# Patient Record
Sex: Female | Born: 1965 | Race: White | Hispanic: Yes | Marital: Married | Smoking: Never smoker
Health system: Southern US, Community
[De-identification: ages and names within clinical notes are randomized; demographics above are authoritative.]

## PROBLEM LIST (undated history)

## (undated) DIAGNOSIS — B019 Varicella without complication: Secondary | ICD-10-CM

## (undated) DIAGNOSIS — T7840XA Allergy, unspecified, initial encounter: Secondary | ICD-10-CM

## (undated) HISTORY — PX: ABDOMINAL HYSTERECTOMY: SHX81

## (undated) HISTORY — DX: Allergy, unspecified, initial encounter: T78.40XA

## (undated) HISTORY — DX: Varicella without complication: B01.9

---

## 2007-02-07 ENCOUNTER — Ambulatory Visit (HOSPITAL_COMMUNITY): Admission: RE | Admit: 2007-02-07 | Discharge: 2007-02-07 | Payer: Self-pay | Admitting: Obstetrics & Gynecology

## 2007-02-25 ENCOUNTER — Emergency Department (HOSPITAL_COMMUNITY): Admission: EM | Admit: 2007-02-25 | Discharge: 2007-02-25 | Payer: Self-pay | Admitting: Emergency Medicine

## 2009-01-05 ENCOUNTER — Encounter: Payer: Self-pay | Admitting: Gynecology

## 2009-01-05 ENCOUNTER — Other Ambulatory Visit: Admission: RE | Admit: 2009-01-05 | Discharge: 2009-01-05 | Payer: Self-pay | Admitting: Gynecology

## 2009-01-05 ENCOUNTER — Ambulatory Visit: Payer: Self-pay | Admitting: Gynecology

## 2019-11-01 ENCOUNTER — Encounter: Payer: Self-pay | Admitting: Family Medicine

## 2019-11-01 ENCOUNTER — Other Ambulatory Visit: Payer: Self-pay

## 2019-11-01 ENCOUNTER — Ambulatory Visit (INDEPENDENT_AMBULATORY_CARE_PROVIDER_SITE_OTHER): Payer: BC Managed Care – PPO | Admitting: Family Medicine

## 2019-11-01 ENCOUNTER — Emergency Department (HOSPITAL_COMMUNITY)
Admission: EM | Admit: 2019-11-01 | Discharge: 2019-11-01 | Discharge status: Absent without leave | Payer: BC Managed Care – PPO

## 2019-11-01 VITALS — BP 110/70 | HR 92 | Temp 97.6°F | Resp 16 | Ht 61.0 in | Wt 150.4 lb

## 2019-11-01 DIAGNOSIS — Z13220 Encounter for screening for lipoid disorders: Secondary | ICD-10-CM

## 2019-11-01 DIAGNOSIS — R42 Dizziness and giddiness: Secondary | ICD-10-CM | POA: Diagnosis not present

## 2019-11-01 DIAGNOSIS — Z130 Encounter for screening for diseases of the blood and blood-forming organs and certain disorders involving the immune mechanism: Secondary | ICD-10-CM

## 2019-11-01 DIAGNOSIS — Z0000 Encounter for general adult medical examination without abnormal findings: Secondary | ICD-10-CM

## 2019-11-01 DIAGNOSIS — Z13228 Encounter for screening for other metabolic disorders: Secondary | ICD-10-CM

## 2019-11-01 DIAGNOSIS — Z1231 Encounter for screening mammogram for malignant neoplasm of breast: Secondary | ICD-10-CM

## 2019-11-01 DIAGNOSIS — Z1159 Encounter for screening for other viral diseases: Secondary | ICD-10-CM | POA: Diagnosis not present

## 2019-11-01 DIAGNOSIS — Z1329 Encounter for screening for other suspected endocrine disorder: Secondary | ICD-10-CM

## 2019-11-01 DIAGNOSIS — H5509 Other forms of nystagmus: Secondary | ICD-10-CM

## 2019-11-01 NOTE — ED Notes (Signed)
I called patient in the lobby and outside to be triage and no one responded 

## 2019-11-01 NOTE — Progress Notes (Signed)
HPI: Laura Daniel is a 54 y.o. female, who is here today to establish care.  Former PCP: Dr Acquanetta Chain. Moved from [place] in 01/2019. Last preventive routine visit: 1-2 years. Concerns today: Dizziness and she would like her CPE today. Chronic medical problems: Seasonal allergies and GI "problems. No Hx of DM,HTN,HLD,or CVD.  She lives with her husband and 2 daughters. S/P hysterectomy due to heavy menses. Colonoscopy in 2019. Sleeps about 5-6 hours. Mammogram > a year ago.  She exercises regularly, walking 2-3 times per week. A couple of weeks ago she started keto diet. Since she changed her diet she has not had digestive symptoms.  Dizziness: Started about 2-3 weeks ago. 3 weeks ago she started with nausea and vomiting after eating shrimp. While vomiting she felt ear fullness sensation and started with spinning sensation. Problem is intermittent. Exacerbated by head movement,getting up,and when lying on her back. It lasts a few seconds. Improved today. No prior Hx. No recent travel or respiratory illness. She has not noted hearing changes,tinnitus,headache,visual changes, or focal deficit. She has not tried OTC medication.  She has an appt with ENT in 11/2019.  Review of Systems  Constitutional: Negative for activity change, appetite change, fatigue and fever.  HENT: Negative for mouth sores, nosebleeds, rhinorrhea and sore throat.   Eyes: Negative for redness and visual disturbance.  Respiratory: Negative for cough, shortness of breath and wheezing.   Cardiovascular: Negative for chest pain, palpitations and leg swelling.  Gastrointestinal: Negative for abdominal pain, nausea and vomiting.       Negative for changes in bowel habits.  Genitourinary: Negative for decreased urine volume, dysuria and hematuria.  Musculoskeletal: Negative for gait problem and myalgias.  Skin: Negative for pallor and rash.  Allergic/Immunologic: Positive for environmental allergies.    Neurological: Negative for syncope, facial asymmetry and speech difficulty.  Rest see pertinent positives and negatives per HPI.  No current outpatient medications on file prior to visit.   No current facility-administered medications on file prior to visit.   Past Medical History:  Diagnosis Date  . Allergy   . Chicken pox    Allergies  Allergen Reactions  . Pollen Extract     Family History  Problem Relation Age of Onset  . Diabetes Mother   . Cancer Father   . Cancer Sister   . Asthma Daughter   . Alcohol abuse Maternal Grandfather   . Heart attack Maternal Grandfather     Social History   Socioeconomic History  . Marital status: Married    Spouse name: Not on file  . Number of children: Not on file  . Years of education: Not on file  . Highest education level: Not on file  Occupational History  . Not on file  Tobacco Use  . Smoking status: Never Smoker  . Smokeless tobacco: Never Used  Substance and Sexual Activity  . Alcohol use: Not on file  . Drug use: Not on file  . Sexual activity: Not on file  Other Topics Concern  . Not on file  Social History Narrative  . Not on file   Social Determinants of Health   Financial Resource Strain:   . Difficulty of Paying Living Expenses:   Food Insecurity:   . Worried About Charity fundraiser in the Last Year:   . Arboriculturist in the Last Year:   Transportation Needs:   . Film/video editor (Medical):   Marland Kitchen Lack of Transportation (Non-Medical):  Physical Activity:   . Days of Exercise per Week:   . Minutes of Exercise per Session:   Stress:   . Feeling of Stress :   Social Connections:   . Frequency of Communication with Friends and Family:   . Frequency of Social Gatherings with Friends and Family:   . Attends Religious Services:   . Active Member of Clubs or Organizations:   . Attends Archivist Meetings:   Marland Kitchen Marital Status:     Vitals:   11/01/19 0859  BP: 110/70  Pulse: 92   Resp: 16  Temp: 97.6 F (36.4 C)  SpO2: 98%   Body mass index is 28.41 kg/m.  Physical Exam Vitals and nursing note reviewed.  Constitutional:      General: She is not in acute distress.    Appearance: She is well-developed.  HENT:     Head: Normocephalic and atraumatic.     Right Ear: Hearing, tympanic membrane, ear canal and external ear normal.     Left Ear: Hearing, tympanic membrane, ear canal and external ear normal.     Ears:     Comments: Dix-Hallpike maneuver elicited spinning sensation and rapid vertical nystagmus, R>L.    Mouth/Throat:     Mouth: Mucous membranes are moist.     Pharynx: Oropharynx is clear. Uvula midline.  Eyes:     Extraocular Movements:     Right eye: Nystagmus present.     Left eye: Nystagmus present.     Conjunctiva/sclera: Conjunctivae normal.     Pupils: Pupils are equal, round, and reactive to light.  Neck:     Thyroid: No thyromegaly.     Trachea: No tracheal deviation.  Cardiovascular:     Rate and Rhythm: Normal rate and regular rhythm.     Pulses:          Dorsalis pedis pulses are 2+ on the right side and 2+ on the left side.     Heart sounds: No murmur heard.   Pulmonary:     Effort: Pulmonary effort is normal. No respiratory distress.     Breath sounds: Normal breath sounds.  Abdominal:     Palpations: Abdomen is soft. There is no hepatomegaly or mass.     Tenderness: There is no abdominal tenderness.  Genitourinary:    Comments: Breast: No masses,nipple discharge,or skin changes bilateral. Musculoskeletal:     Comments: No major deformity or signs of synovitis appreciated.  Lymphadenopathy:     Cervical: No cervical adenopathy.     Upper Body:     Right upper body: No supraclavicular adenopathy.     Left upper body: No supraclavicular adenopathy.  Skin:    General: Skin is warm.     Findings: No erythema or rash.  Neurological:     Mental Status: She is alert and oriented to person, place, and time.     Cranial  Nerves: No cranial nerve deficit.     Coordination: Coordination normal.     Gait: Gait normal.     Deep Tendon Reflexes:     Reflex Scores:      Bicep reflexes are 2+ on the right side and 2+ on the left side.      Patellar reflexes are 2+ on the right side and 2+ on the left side. Psychiatric:        Speech: Speech normal.     Comments: Well groomed, good eye contact.    ASSESSMENT AND PLAN:  Ms. Nekeya was seen today  for establish care and annual exam.  Diagnoses and all orders for this visit: Orders Placed This Encounter  Procedures  . Mammogram Digital Screening  . MR Brain W Wo Contrast  . Comprehensive metabolic panel  . Hemoglobin A1c  . Hepatitis C antibody screen  . Lipid panel  . TSH   Lab Results  Component Value Date   CHOL 169 11/01/2019   HDL 84 11/01/2019   LDLCALC 72 11/01/2019   TRIG 51 11/01/2019   CHOLHDL 2.0 11/01/2019   Lab Results  Component Value Date   TSH 2.81 11/01/2019   Lab Results  Component Value Date   HGBA1C 5.2 11/01/2019   Lab Results  Component Value Date   ALT 28 11/01/2019   AST 16 11/01/2019   BILITOT 0.5 11/01/2019   Lab Results  Component Value Date   CREATININE 0.61 11/01/2019   BUN 16 11/01/2019   NA 140 11/01/2019   K 4.1 11/01/2019   CL 105 11/01/2019   CO2 25 11/01/2019   Routine general medical examination at a health care facility We discussed the importance of regular physical activity and healthy diet for prevention of chronic illness and/or complications. Preventive guidelines reviewed. Vaccination up to date.  Ca++ and vit D supplementation recommended. Next CPE in a year.  The 10-year ASCVD risk score Mikey Bussing DC Brooke Bonito., et al., 2013) is: 0.6%   Values used to calculate the score:     Age: 35 years     Sex: Female     Is Non-Hispanic African American: No     Diabetic: No     Tobacco smoker: No     Systolic Blood Pressure: 157 mmHg     Is BP treated: No     HDL Cholesterol: 84 mg/dL     Total  Cholesterol: 169 mg/dL  Vertigo Hx suggests positional vertigo. Fall precautions. Keep appt with ENT.  Encounter for screening mammogram for malignant neoplasm of breast -     Mammogram Digital Screening; Future  Encounter for HCV screening test for low risk patient -     Hepatitis C antibody screen  Screening for lipoid disorders -     Lipid panel  Screening for endocrine, metabolic and immunity disorder -     Comprehensive metabolic panel; Future -     Hemoglobin A1c; Future  Vertical nystagmus We discussed possible etiologies.  Instructed about warning signs. Keep appt with ENT. Brain MRI will be arranged.   Return in 1 year (on 10/31/2020), or cpe, for cpe.   Urho Rio G. [place], MD  [facility]. [place] office.    A few things to remember from today's visit:   Cuidados preventivos entre os 40-64 anos, mulheres Preventive Care 60-28 Years Old, Female Cuidados preventivos se referem a consultas com seu mdico e a escolhas de estilo de vida que podem promover sade e bem-estar. Isso inclui:  Um exame fsico anual. Tambm  chamado check-up anual.  Consultas regulares ao dentista e exames oftalmolgicos.  Imunizaes.  Triagem para certas doenas.  Escolhas saudveis de estilo de vida, como manter uma dieta saudvel, fazer exerccios regularmente, no usar drogas ou produtos que contenham nicotina e tabaco e limitar o consumo de lcool. O que posso esperar de minhas consultas preventivas? Exame fsico Seu mdico verificar sua:  Altura e peso. Isso pode ser usado para calcular o IMC (ndice de massa corporal), que diz se voc est com um peso saudvel.  Corky Sox cardaca e presso arterial.  Pele, para ver  se h manchas com alteraes. Aconselhamento Seu mdico tambm poder fazer perguntas sobre:  O uso de tabaco, lcool e drogas.  O bem-estar emocional.  O bem-estar em casa e nos relacionamentos.  A atividade sexual.  Os hbitos  alimentares.  O trabalho e o ambiente de trabalho.  O mtodo anticoncepcional.  O ciclo menstrual.  O histrico de gravidez. Quais imunizaes eu preciso tomar?  Vacina contra influenza (gripe)  Essa vacina  recomendada todos os anos. Vacina contra ttano, difteria e coqueluche (vacina Tdap)  Voc poder precisar de um reforo da Td a cada 10 anos. Vacina contra catapora (varicela)  Voc poder precisar dessa vacina se ainda no tiver sido vacinada. Vacina contra herpes zster (cobreiro)  Voc poder precisar dela depois dos 60 anos. Vacina contra sarampo, rubola e caxumba (MMR)  Voc poder precisar de pelo menos uma dose da MMR se tiver nascido em 1957 ou posteriormente. Voc tambm poder precisar de uma segunda dose. Vacina conjugada pneumoccica (PCV13)  Voc poder precisar dessa vacina se sofrer de certos quadros clnicos e ainda no tiver [employer]. Vacina pneumoccica polissacardica (PPSV23)  Voc poder precisar de uma ou duas doses caso fume cigarros ou sofra de certos quadros clnicos. Vacina meningoccica conjugada (MenACWY)  Voc poder precisar dela se sofrer de certos quadros clnicos. Vacina contra hepatite A  Voc poder precisar dessa vacina se apresentar certos quadros clnicos ou viajar para ou trabalhar em lugares nos quais pode ser exposto  hepatite A. Vacina contra hepatite B  Voc poder precisar dessa vacina se apresentar certos quadros clnicos ou viajar para ou trabalhar em lugares nos quais pode ser exposto  hepatite B. Vacina contra o Haemophilus influenzae tipo b (Hib)  Voc poder precisar dela se sofrer de certos quadros clnicos. Vacina contra o papilomavrus humano (HPV)  Se recomendado pelo seu mdico, voc poder precisar de trs doses ao longo de 6 meses. Voc pode tomar vacinas em doses individuais ou diversas vacinas juntas em uma nica dose (vacinas combinadas). Converse com seu mdico sobre os riscos e benefcios das  vacinas combinadas. Quais testes eu preciso fazer? Exames de sangue  Nveis de lipdeos e colesterol. Esses nveis podero ser verificados a cada 5 anos ou com maior frequncia caso voc tenha mais de 50 anos de idade.  Exame de hepatite C.  Exame de hepatite B. Exames preventivos  Exame preventivo de cncer de pulmo. Voc poder fazer esse exame preventivo comeando aos 55 anos se tiver histrico de consumo de 30 unidades mao-ano e fume atualmente ou tenha parado nos ltimos 15 anos.  Exame preventivo do cncer colorretal. Todos os adultos, a [employer] 50 at os 75 anos de idade, devem fazer esse exame preventivo. Seu mdico poder recomendar o exame preventivo aos 45 anos, caso voc tenha maior risco. Voc far exames a cada 1-10 anos, dependendo [employer] e do tipo de exame preventivo.  Exame preventivo de diabetes. Isso  feito verificando-se o acar no sangue (glicose) depois de voc no comer por algum tempo (jejum). Isso poder ser feito a cada 1-3 anos.  Mamografia. Poder ser feita a cada 1-2 anos. Converse com seu mdico sobre a frequncia com a Community education officer. Isso poder depender de voc ter ou no histrico familiar de cncer de mama.  Exame de preveno do cncer baseado em BRCA. Esse exame poder ser realizado caso voc tenha histrico de cncer de mama, de ovrio, das tubas uterinas ou de peritnio.  Exame plvico e de Papanicolau.  Isso poder ser [employer] a cada 3 anos a [employer] 21 anos de idade. Comeando aos 30 anos de idade, esses exames podero ser feitos a cada 5 anos caso voc realize um exame de Papanicolau junto com um teste de HPV. Outros exames  Exame para doenas sexualmente transmissveis (DSTs).  Exame da densidade ssea. Esse exame  feito para verificar a existncia de osteoporose. Voc poder fazer esse exame caso tenha risco elevado de osteoporose. Siga essas instrues em casa: Alimentos e bebidas  Mantenha  uma dieta que inclua frutas e verduras frescas, gros integrais, alimentos, fontes magras de protena e produtos lcteos pobres em gordura.  Tome suplementos de vitaminas e minerais de acordo com as recomendaes do seu mdico.  No consuma lcool se: ? Seu mdico disser para voc no beber. ? Estiver Gordy Clement, puder estar grvida ou planejar engravidar.  Se voc consome lcool: ? Limite seu consumo a 0-1 dose por dia. ? Observe quanto lcool sua bebida contm. Nos EUA, um drinque equivale a uma lata de cerveja de 12 oz (355 ml), uma taa de vinho de 5 oz (148 ml) ou uma dose de bebida destilada de 1 oz (44 ml). Estilo de vida  Faa o cuidado dirio dos seus dentes e gengivas.  Permanea ativa. Exercite-se por pelo menos 30 minutos em 5 dias da semana ou [place].  No use nenhum produto que contenha nicotina ou tabaco, como cigarros tradicionais, cigarros eletrnicos e fumo de Higher education careers adviser. Caso precise de ajuda para parar de fumar, fale com seu mdico.  Se voc for sexualmente ativa, faa sexo seguro. Use um preservativo ou outra forma de controle de natalidade (contracepo), para evitar a Occupational hygienist e DSTs (doenas sexualmente transmissveis).  Se orientado pelo seu mdico, tome aspirina em dose baixa diariamente a [employer] 50 anos de idade. O que vem a seguir?  Visite seu mdico uma vez por ano para uma consulta de check-up.  Pergunte ao seu mdico com que frequncia seus olhos e dentes devem ser examinados.  Mantenha todas assuas vacinas em dia. Estas informaes no se destinam a substituir as recomendaes de seu mdico. No deixe de discutir quaisquer dvidas com seu mdico. Document Revised: 12/29/2017 Document Reviewed: 12/29/2017 Elsevier Patient Education  2020 [employer].

## 2019-11-01 NOTE — Patient Instructions (Addendum)
A few things to remember from today's visit:   Routine general medical examination at a health care facility  Vertigo - Plan: TSH, MR Brain W Wo Contrast  Encounter for screening mammogram for malignant neoplasm of breast - Plan: Mammogram Digital Screening  Encounter for HCV screening test for low risk patient - Plan: Hepatitis C antibody screen  Screening for lipoid disorders - Plan: Lipid panel  Screening for endocrine, metabolic and immunity disorder - Plan: Comprehensive metabolic panel, Hemoglobin A1c  Vertical nystagmus - Plan: MR Brain W Wo Contrast   Cuidados preventivos entre os 53-64 anos, mulheres Preventive Care 53-50 Years Old, Female Cuidados preventivos se referem a consultas com seu mdico e a escolhas de estilo de vida que podem promover sade e bem-estar. Isso inclui:  Um exame fsico anual. Tambm  chamado check-up anual.  Consultas regulares ao dentista e exames oftalmolgicos.  Imunizaes.  Triagem para certas doenas.  Escolhas saudveis de estilo de vida, como manter uma dieta saudvel, fazer exerccios regularmente, no usar drogas ou produtos que contenham nicotina e tabaco e limitar o consumo de lcool. O que posso esperar de minhas consultas preventivas? Exame fsico Seu mdico verificar sua:  Altura e peso. Isso pode ser usado para calcular o IMC (ndice de massa corporal), que diz se voc est com um peso saudvel.  Corky Sox cardaca e presso arterial.  Pele, para ver se h manchas com alteraes. Aconselhamento Seu mdico tambm poder fazer perguntas sobre:  O uso de tabaco, lcool e drogas.  O bem-estar emocional.  O bem-estar em casa e nos relacionamentos.  A atividade sexual.  Os hbitos alimentares.  O trabalho e o ambiente de trabalho.  O mtodo anticoncepcional.  O ciclo menstrual.  O histrico de gravidez. Quais imunizaes eu preciso tomar?  Vacina contra influenza (gripe)  Essa vacina  recomendada todos os  anos. Vacina contra ttano, difteria e coqueluche (vacina Tdap)  Voc poder precisar de um reforo da Td a cada 10 anos. Vacina contra catapora (varicela)  Voc poder precisar dessa vacina se ainda no tiver sido vacinada. Vacina contra herpes zster (cobreiro)  Voc poder precisar dela depois dos 53 anos. Vacina contra sarampo, rubola e caxumba (MMR)  Voc poder precisar de pelo menos uma dose da MMR se tiver nascido em 1957 ou posteriormente. Voc tambm poder precisar de uma segunda dose. Vacina conjugada pneumoccica (PCV13)  Voc poder precisar dessa vacina se sofrer de certos quadros clnicos e ainda no tiver [employer]. Vacina pneumoccica polissacardica (PPSV23)  Voc poder precisar de uma ou duas doses caso fume cigarros ou sofra de certos quadros clnicos. Vacina meningoccica conjugada (MenACWY)  Voc poder precisar dela se sofrer de certos quadros clnicos. Vacina contra hepatite A  Voc poder precisar dessa vacina se apresentar certos quadros clnicos ou viajar para ou trabalhar em lugares nos quais pode ser exposto  hepatite A. Vacina contra hepatite B  Voc poder precisar dessa vacina se apresentar certos quadros clnicos ou viajar para ou trabalhar em lugares nos quais pode ser exposto  hepatite B. Vacina contra o Haemophilus influenzae tipo b (Hib)  Voc poder precisar dela se sofrer de certos quadros clnicos. Vacina contra o papilomavrus humano (HPV)  Se recomendado pelo seu mdico, voc poder precisar de trs doses ao longo de 6 meses. Voc pode tomar vacinas em doses individuais ou diversas vacinas juntas em uma nica dose (vacinas combinadas). Converse com seu mdico sobre os riscos e benefcios das vacinas combinadas. Quais testes eu preciso fazer? Exames de  sangue  Nveis de lipdeos e colesterol. Esses nveis podero ser verificados a cada 5 anos ou com maior frequncia caso voc tenha mais de 50 anos de idade.  Exame de hepatite  C.  Exame de hepatite B. Exames preventivos  Exame preventivo de cncer de pulmo. Voc poder fazer esse exame preventivo comeando aos 53 anos se tiver histrico de consumo de 30 unidades mao-ano e fume atualmente ou tenha parado nos ltimos 15 anos.  Exame preventivo do cncer colorretal. Todos os adultos, a [employer] 50 at os 75 anos de idade, devem fazer esse exame preventivo. Seu mdico poder recomendar o exame preventivo aos 45 anos, caso voc tenha maior risco. Voc far exames a cada 1-10 anos, dependendo [employer] e do tipo de exame preventivo.  Exame preventivo de diabetes. Isso  feito verificando-se o acar no sangue (glicose) depois de voc no comer por algum tempo (jejum). Isso poder ser feito a cada 1-3 anos.  Mamografia. Poder ser feita a cada 1-2 anos. Converse com seu mdico sobre a frequncia com a Community education officer. Isso poder depender de voc ter ou no histrico familiar de cncer de mama.  Exame de preveno do cncer baseado em BRCA. Esse exame poder ser realizado caso voc tenha histrico de cncer de mama, de ovrio, das tubas uterinas ou de peritnio.  Exame plvico e de Papanicolau. Isso poder ser [employer] a cada 3 anos a [employer] 21 anos de idade. Comeando aos 30 anos de idade, esses exames podero ser feitos a cada 5 anos caso voc realize um exame de Papanicolau junto com um teste de HPV. Outros exames  Exame para doenas sexualmente transmissveis (DSTs).  Exame da densidade ssea. Esse exame  feito para verificar a existncia de osteoporose. Voc poder fazer esse exame caso tenha risco elevado de osteoporose. Siga essas instrues em casa: Alimentos e bebidas  Mantenha uma dieta que inclua frutas e verduras frescas, gros integrais, alimentos, fontes magras de protena e produtos lcteos pobres em gordura.  Tome suplementos de vitaminas e minerais de acordo com as recomendaes do seu mdico.  No consuma  lcool se: ? Seu mdico disser para voc no beber. ? Estiver Gordy Clement, puder estar grvida ou planejar engravidar.  Se voc consome lcool: ? Limite seu consumo a 0-1 dose por dia. ? Observe quanto lcool sua bebida contm. Nos EUA, um drinque equivale a uma lata de cerveja de 12 oz (355 ml), uma taa de vinho de 5 oz (148 ml) ou uma dose de bebida destilada de 1 oz (44 ml). Estilo de vida  Faa o cuidado dirio dos seus dentes e gengivas.  Permanea ativa. Exercite-se por pelo menos 30 minutos em 5 dias da semana ou [place].  No use nenhum produto que contenha nicotina ou tabaco, como cigarros tradicionais, cigarros eletrnicos e fumo de Higher education careers adviser. Caso precise de ajuda para parar de fumar, fale com seu mdico.  Se voc for sexualmente ativa, faa sexo seguro. Use um preservativo ou outra forma de controle de natalidade (contracepo), para evitar a Occupational hygienist e DSTs (doenas sexualmente transmissveis).  Se orientado pelo seu mdico, tome aspirina em dose baixa diariamente a [employer] 50 anos de idade. O que vem a seguir?  Visite seu mdico uma vez por ano para uma consulta de check-up.  Pergunte ao seu mdico com que frequncia seus olhos e dentes devem ser examinados.  Mantenha todas assuas vacinas em dia. Estas informaes no se destinam a substituir as recomendaes de seu  mdico. No deixe de discutir quaisquer dvidas com seu mdico. Document Revised: 12/29/2017 Document Reviewed: 12/29/2017 Elsevier Patient Education  [place].

## 2019-11-04 LAB — HEPATITIS C ANTIBODY
Hepatitis C Ab: NONREACTIVE
SIGNAL TO CUT-OFF: 0.01 (ref <1.00)

## 2019-11-04 LAB — COMPREHENSIVE METABOLIC PANEL
AG Ratio: 1.7 (calc) (ref 1.0–2.5)
ALT: 28 U/L (ref 6–29)
AST: 16 U/L (ref 10–35)
Albumin: 4.3 g/dL (ref 3.6–5.1)
Alkaline phosphatase (APISO): 68 U/L (ref 37–153)
BUN: 16 mg/dL (ref 7–25)
CO2: 25 mmol/L (ref 20–32)
Calcium: 9.3 mg/dL (ref 8.6–10.4)
Chloride: 105 mmol/L (ref 98–110)
Creat: 0.61 mg/dL (ref 0.50–1.05)
Globulin: 2.5 g/dL (calc) (ref 1.9–3.7)
Glucose, Bld: 91 mg/dL (ref 65–99)
Potassium: 4.1 mmol/L (ref 3.5–5.3)
Sodium: 140 mmol/L (ref 135–146)
Total Bilirubin: 0.5 mg/dL (ref 0.2–1.2)
Total Protein: 6.8 g/dL (ref 6.1–8.1)

## 2019-11-04 LAB — LIPID PANEL
Cholesterol: 169 mg/dL (ref <200)
HDL: 84 mg/dL (ref >=50)
LDL Cholesterol (Calc): 72 mg/dL (calc)
Non-HDL Cholesterol (Calc): 85 mg/dL (calc) (ref <130)
Total CHOL/HDL Ratio: 2 (calc) (ref <5.0)
Triglycerides: 51 mg/dL (ref <150)

## 2019-11-04 LAB — HEMOGLOBIN A1C
Hgb A1c MFr Bld: 5.2 % of total Hgb (ref <5.7)
Mean Plasma Glucose: 103 (calc)
eAG (mmol/L): 5.7 (calc)

## 2019-11-04 LAB — TSH: TSH: 2.81 mIU/L

## 2019-11-28 ENCOUNTER — Other Ambulatory Visit: Payer: BC Managed Care – PPO

## 2019-12-04 ENCOUNTER — Ambulatory Visit: Payer: BC Managed Care – PPO

## 2019-12-04 ENCOUNTER — Other Ambulatory Visit: Payer: Self-pay | Admitting: Family Medicine

## 2019-12-04 DIAGNOSIS — Z1231 Encounter for screening mammogram for malignant neoplasm of breast: Secondary | ICD-10-CM

## 2020-04-19 ENCOUNTER — Encounter (HOSPITAL_BASED_OUTPATIENT_CLINIC_OR_DEPARTMENT_OTHER): Payer: Self-pay | Admitting: Emergency Medicine

## 2020-04-19 ENCOUNTER — Other Ambulatory Visit: Payer: Self-pay

## 2020-04-19 ENCOUNTER — Emergency Department (HOSPITAL_BASED_OUTPATIENT_CLINIC_OR_DEPARTMENT_OTHER)
Admission: EM | Admit: 2020-04-19 | Discharge: 2020-04-19 | Disposition: A | Discharge status: Home / Self Care | Payer: BC Managed Care – PPO | Attending: Emergency Medicine | Admitting: Emergency Medicine

## 2020-04-19 DIAGNOSIS — M545 Low back pain, unspecified: Secondary | ICD-10-CM | POA: Insufficient documentation

## 2020-04-19 DIAGNOSIS — Z20822 Contact with and (suspected) exposure to covid-19: Secondary | ICD-10-CM | POA: Insufficient documentation

## 2020-04-19 DIAGNOSIS — R059 Cough, unspecified: Secondary | ICD-10-CM | POA: Diagnosis not present

## 2020-04-19 MED ORDER — METHOCARBAMOL 500 MG PO TABS: 500.0000 mg | ORAL_TABLET | Freq: Two times a day (BID) | ORAL | 0 refills | Status: DC

## 2020-04-19 MED ORDER — METHOCARBAMOL 500 MG PO TABS
500.0000 mg | ORAL_TABLET | Freq: Once | ORAL | Status: AC
  Administered 2020-04-19: 500 mg via ORAL
  Filled 2020-04-19: qty 1

## 2020-04-19 MED ORDER — BENZONATATE 100 MG PO CAPS: 100.0000 mg | ORAL_CAPSULE | Freq: Three times a day (TID) | ORAL | 0 refills | Status: DC

## 2020-04-19 MED ORDER — NAPROXEN 500 MG PO TABS: 500.0000 mg | ORAL_TABLET | Freq: Two times a day (BID) | ORAL | 0 refills | Status: DC

## 2020-04-19 MED ORDER — LIDOCAINE 5 % EX PTCH
1.0000 | MEDICATED_PATCH | CUTANEOUS | Status: DC
  Administered 2020-04-19: 1 via TRANSDERMAL
  Filled 2020-04-19: qty 1

## 2020-04-19 MED ORDER — BENZONATATE 100 MG PO CAPS
100.0000 mg | ORAL_CAPSULE | Freq: Once | ORAL | Status: AC
  Administered 2020-04-19: 100 mg via ORAL
  Filled 2020-04-19: qty 1

## 2020-04-19 NOTE — ED Provider Notes (Signed)
[facility] EMERGENCY DEPARTMENT Provider Note   CSN: [id number] Arrival date & time: 04/19/20  1151     History Chief Complaint  Patient presents with  . Cough    Laura Daniel is a 55 y.o. female with no significant past medical history who presents to the ED due to dry cough and low back pain x4 days.  Patient states cough is dry in nature.  Patient's daughter was sick with similar symptoms a few days prior. Patient also admits to bilateral low back pain. Denies chronic low back pain. Denies injury.  Denies saddle paresthesias, bowel/bladder cons, lower extreme numbness/tingling, lower extremity weakness, and IV drug use.  She has been taking ibuprofen with no relief. She has received the J&J vaccine, but no booster. Denies fever, chills, abdominal pain, nausea, vomiting, and diarrhea. Denies chest pain and shortness of breath. No lower extremity edema.   History obtained from patient and past medical records. Patient deferred official translator and used daughter at bedside.     Past Medical History:  Diagnosis Date  . Allergy   . Chicken pox denies    There are no problems to display for this patient.   Past Surgical History:  Procedure Laterality Date  . ABDOMINAL HYSTERECTOMY       OB History   No obstetric history on file.     Family History  Problem Relation Age of Onset  . Diabetes Mother   . Cancer Father   . Cancer Sister   . Asthma Daughter   . Alcohol abuse Maternal Grandfather   . Heart attack Maternal Grandfather     Social History   Tobacco Use  . Smoking status: Never Smoker  . Smokeless tobacco: Never Used  Vaping Use  . Vaping Use: Never used  Substance Use Topics  . Alcohol use: Never  . Drug use: Never    Home Medications Prior to Admission medications   Medication Sig Start Date End Date Taking? Authorizing Provider  benzonatate (TESSALON) 100 MG capsule Take 1 capsule (100 mg total) by mouth every 8 (eight) hours.  04/19/20  Yes Patrycja Mumpower, Merla Riches, PA-C  methocarbamol (ROBAXIN) 500 MG tablet Take 1 tablet (500 mg total) by mouth 2 (two) times daily. 04/19/20  Yes Lowery Paullin C, PA-C  naproxen (NAPROSYN) 500 MG tablet Take 1 tablet (500 mg total) by mouth 2 (two) times daily. 04/19/20  Yes Mataio Mele, Merla Riches, PA-C    Allergies    Pollen extract  Review of Systems   Review of Systems  Constitutional: Negative for chills and fever.  Respiratory: Positive for cough. Negative for shortness of breath.   Cardiovascular: Negative for chest pain and leg swelling.  Gastrointestinal: Negative for abdominal pain, diarrhea, nausea and vomiting.  Musculoskeletal: Positive for back pain. Negative for gait problem.  Neurological: Negative for numbness.  All other systems reviewed and are negative.   Physical Exam Updated Vital Signs BP 130/89 (BP Location: Right Arm)   Pulse 98   Temp 98.7 F (37.1 C) (Oral)   Resp 18   Ht 5\' 1"  (1.549 m)   Wt 65.8 kg   SpO2 98%   BMI 27.40 kg/m   Physical Exam Vitals and nursing note reviewed.  Constitutional:      General: She is not in acute distress.    Appearance: She is not ill-appearing.  HENT:     Head: Normocephalic.     Mouth/Throat:     Comments: Posterior oropharynx clear and mucous membranes moist,  there is mild erythema but no edema or tonsillar exudates, uvula midline, normal phonation, no trismus, tolerating secretions without difficulty. Eyes:     Pupils: Pupils are equal, round, and reactive to light.  Neck:     Comments: No meningismus.  No cervical midline tenderness Cardiovascular:     Rate and Rhythm: Normal rate and regular rhythm.     Pulses: Normal pulses.     Heart sounds: Normal heart sounds. No murmur heard. No friction rub. No gallop.   Pulmonary:     Effort: Pulmonary effort is normal.     Breath sounds: Normal breath sounds.     Comments: Respirations equal and unlabored, patient able to speak in full sentences, lungs clear  to auscultation bilaterally Abdominal:     General: Abdomen is flat. There is no distension.     Palpations: Abdomen is soft.     Tenderness: There is no abdominal tenderness. There is no guarding or rebound.  Musculoskeletal:     Cervical back: Neck supple.     Comments: No thoracic or lumbar midline tenderness.  Bilateral lumbar paraspinal tenderness.  No overlying erythema or warmth.  Skin:    General: Skin is warm and dry.  Neurological:     General: No focal deficit present.     Mental Status: She is alert.  Psychiatric:        Mood and Affect: Mood normal.        Behavior: Behavior normal.     ED Results / Procedures / Treatments   Labs (all labs ordered are listed, but only abnormal results are displayed) Labs Reviewed  SARS CORONAVIRUS 2 (TAT 6-24 HRS)    EKG None  Radiology No results found.  Procedures Procedures (including critical care time)  Medications Ordered in ED Medications  benzonatate (TESSALON) capsule 100 mg (has no administration in time range)  methocarbamol (ROBAXIN) tablet 500 mg (has no administration in time range)  lidocaine (LIDODERM) 5 % 1 patch (has no administration in time range)    ED Course  I have reviewed the triage vital signs and the nursing notes.  Pertinent labs & imaging results that were available during my care of the patient were reviewed by me and considered in my medical decision making (see chart for details).    MDM Rules/Calculators/A&P                         55 year old female presents to the ED due to dry cough and bilateral low back pain x4 days.  Patient's daughter had similar symptoms a few days prior.  She is received her J & J vaccine, but no booster shot.  No fever, chills, chest pain, or shortness of breath.  Patient denies saddle paresthesias, bowel/bladder incontinence, lower extremity numbness/tingling, lower extremity weakness, IV drug use. Stable vitals. Patient in no acute distress and  nontoxic-appearing.  Physical exam reassuring.  Lungs clear to auscultation bilaterally.  No rales, rhonchi, or wheeze.  Low suspicion for pneumonia.  No meningismus to suggest meningitis.  Throat with mild erythema and no tonsillar hypertrophy or exudates.  No abscess appreciated on exam.  Abdomen soft, nondistended, nontender.  No lower extremity edema. Bilateral lumbar paraspinal tenderness. No midline tenderness. Patient able to ambulate in the ED with difficulty. Low suspicion for abscess, cauda equina, or central cord comrpession. Suspect back pain related to myalgias from possible COVID infection. Robaxin and lidoderm patch given for back pain. Patient discharged with symptomatic treatment.  Quarantine guidelines discussed. Strict ED precautions discussed with patient. Patient states understanding and agrees to plan. Patient discharged home in no acute distress and stable vitals.   Final Clinical Impression(s) / ED Diagnoses Final diagnoses:  Suspected COVID-19 virus infection  Acute bilateral low back pain without sciatica    Rx / DC Orders ED Discharge Orders         Ordered    naproxen (NAPROSYN) 500 MG tablet  2 times daily        04/19/20 1727    benzonatate (TESSALON) 100 MG capsule  Every 8 hours        04/19/20 1727    methocarbamol (ROBAXIN) 500 MG tablet  2 times daily        04/19/20 1727           Jesusita Oka 04/19/20 1732    Charlynne Pander, MD 04/19/20 [id number]

## 2020-04-19 NOTE — Discharge Instructions (Signed)
As discussed, your Covid test is pending.  Results should be available within 24 hours.  I have included information on the quarantine guidelines. I am sending you home with pain medication and a muscle relaxer for your back pain.  Muscle relaxer can cause drowsiness so do not drive or operate machinery while on the medication.  I am also sending you home with cough medication.  Take as needed.  Return to the ER if you develop weakness in your legs, issues using the bathroom, numbness/tingling in both your legs, or worsening of symptoms.

## 2020-04-19 NOTE — ED Triage Notes (Signed)
Pt c/o cough and back pain onset 04/15/2020

## 2020-04-20 LAB — SARS CORONAVIRUS 2 (TAT 6-24 HRS): SARS Coronavirus 2: NEGATIVE

## 2020-04-29 ENCOUNTER — Ambulatory Visit: Payer: BC Managed Care – PPO | Admitting: Family Medicine

## 2020-04-29 ENCOUNTER — Other Ambulatory Visit: Payer: Self-pay

## 2020-04-29 ENCOUNTER — Encounter: Payer: Self-pay | Admitting: Family Medicine

## 2020-04-29 VITALS — BP 110/70 | HR 97 | Resp 16 | Ht 61.0 in | Wt 146.0 lb

## 2020-04-29 DIAGNOSIS — J301 Allergic rhinitis due to pollen: Secondary | ICD-10-CM

## 2020-04-29 DIAGNOSIS — M545 Low back pain, unspecified: Secondary | ICD-10-CM

## 2020-04-29 DIAGNOSIS — Z23 Encounter for immunization: Secondary | ICD-10-CM | POA: Diagnosis not present

## 2020-04-29 DIAGNOSIS — J069 Acute upper respiratory infection, unspecified: Secondary | ICD-10-CM

## 2020-04-29 NOTE — Progress Notes (Signed)
Chief Complaint  Patient presents with  . Back Pain   HPI: Laura Daniel is a pleasant 55 y.o. female, who is here today with above concern. Problem has resolved 8 days ago. She started with severe bilateral lower back pain mid 03/2020. No history of back pain. Negative for injuries or unusual physical activity. No associated abdominal pain, nausea, vomiting, changes in bowel habits, urinary symptoms, or skin rash.  She was evaluated in the ER on 04/19/2020, diagnosed with viral respiratory illness, COVID-19 test was negative. Exacerbating and alleviating factors were not identified.  Patch placed in the ER aggravated pain, gradually improved after removing patch. Methocarbamol was prescribed. Negative for radiation to lower extremity, numbness, tingling, saddle anesthesia, or bowel/bladder dysfunction.  Still dysphonic and having some cough and nasal congestion. She is taking medication for cough, benzonatate. Negative for fever, chills, CP, dyspnea, or wheezing.  Allergy rhinitis: She usually takes Zyrtec 10 mg daily. Problem is aggravated by seasonal changes.  Review of Systems  Constitutional: Negative for activity change, appetite change and fatigue.  HENT: Negative for hearing loss, mouth sores and sore throat.   Genitourinary: Negative for decreased urine volume, dysuria, hematuria, vaginal bleeding and vaginal discharge.  Allergic/Immunologic: Positive for environmental allergies.  Hematological: Negative for adenopathy. Does not bruise/bleed easily.  Rest see pertinent positives and negatives per HPI.  Current Outpatient Medications on File Prior to Visit  Medication Sig Dispense Refill  . benzonatate (TESSALON) 100 MG capsule Take 1 capsule (100 mg total) by mouth every 8 (eight) hours. 21 capsule 0  . methocarbamol (ROBAXIN) 500 MG tablet Take 1 tablet (500 mg total) by mouth 2 (two) times daily. 20 tablet 0  . naproxen (NAPROSYN) 500 MG tablet Take 1 tablet  (500 mg total) by mouth 2 (two) times daily. 30 tablet 0   No current facility-administered medications on file prior to visit.     Past Medical History:  Diagnosis Date  . Allergy   . Chicken pox denies   Allergies  Allergen Reactions  . Pollen Extract     Social History   Socioeconomic History  . Marital status: Married    Spouse name: Not on file  . Number of children: Not on file  . Years of education: Not on file  . Highest education level: Not on file  Occupational History  . Not on file  Tobacco Use  . Smoking status: Never Smoker  . Smokeless tobacco: Never Used  Vaping Use  . Vaping Use: Never used  Substance and Sexual Activity  . Alcohol use: Never  . Drug use: Never  . Sexual activity: Not on file  Other Topics Concern  . Not on file  Social History Narrative  . Not on file   Social Determinants of Health   Financial Resource Strain: Not on file  Food Insecurity: Not on file  Transportation Needs: Not on file  Physical Activity: Not on file  Stress: Not on file  Social Connections: Not on file   Vitals:   04/29/20 0721  BP: 110/70  Pulse: 97  Resp: 16  SpO2: 97%   Body mass index is 27.59 kg/m.  Physical Exam Vitals and nursing note reviewed.  Constitutional:      General: She is not in acute distress.    Appearance: She is well-developed. She is not ill-appearing.  HENT:     Head: Normocephalic and atraumatic.     Right Ear: Tympanic membrane, ear canal and external ear normal.  Left Ear: Tympanic membrane, ear canal and external ear normal.     Nose: Septal deviation and rhinorrhea present.     Comments: Mild dysphonia.    Mouth/Throat:     Mouth: Oropharynx is clear and moist and mucous membranes are normal. Mucous membranes are moist.     Pharynx: Oropharynx is clear.  Eyes:     Conjunctiva/sclera: Conjunctivae normal.  Cardiovascular:     Rate and Rhythm: Normal rate and regular rhythm.     Heart sounds: No murmur  heard.   Pulmonary:     Effort: Pulmonary effort is normal. No respiratory distress.     Breath sounds: Normal breath sounds. No stridor.  Lymphadenopathy:     Cervical: No cervical adenopathy.  Skin:    General: Skin is warm.     Findings: No erythema or rash.  Neurological:     General: No focal deficit present.     Mental Status: She is alert and oriented to person, place, and time.     Deep Tendon Reflexes: Strength normal.  Psychiatric:        Mood and Affect: Mood and affect normal.     Comments: Well groomed, good eye contact.   ASSESSMENT AND PLAN:  Laura Daniel was seen today for back pain.  Diagnoses and all orders for this visit:  Acute right-sided low back pain without sciatica Resolved. Most likely related to viral illness. Follow-up as needed.  Allergic rhinitis due to pollen, unspecified seasonality Usually worse around spring. Recommend starting Zyrtec 10 mg daily and Flonase nasal spray mid February. Nasal saline irrigation as needed.  Viral upper respiratory tract infection Acute symptoms have resolved. Explained that cough and congestion can last a few more days and even weeks. Monitor for worsening symptoms. Continue benzonatate to help with cough.  Need for influenza vaccination -     Flu Vaccine QUAD 36+ mos IM   Return if symptoms worsen or fail to improve.   Mackenzey Crownover G. [place], MD  [facility]. [place] office.   A few things to remember from today's visit:  Allergic rhinitis due to pollen, unspecified seasonality  Viral upper respiratory tract infection  Acute right-sided low back pain without sciatica  Flonase nasal spray and Zyrtec 10 mg daily can be started mid February. Nasal saline irrigations as needed.  Please be sure medication list is accurate. If a new problem present, please set up appointment sooner than planned today.

## 2020-04-29 NOTE — Patient Instructions (Addendum)
A few things to remember from today's visit:  Allergic rhinitis due to pollen, unspecified seasonality  Viral upper respiratory tract infection  Acute right-sided low back pain without sciatica  Flonase nasal spray and Zyrtec 10 mg daily can be started mid February. Nasal saline irrigations as needed.  Please be sure medication list is accurate. If a new problem present, please set up appointment sooner than planned today.

## 2021-06-29 NOTE — Progress Notes (Signed)
? ?HPI: ?[name] is a 56 y.o. female, who is here today for her routine physical. ? ?Last CPE: 11/01/19 ? ?Regular exercise: Not consistently with walking during cold weather, she is more active during summer. She does yoga daily for 45 min. ?Following a healthful diet: Cooks at home, a lot of vegetables. ? ?Chronic medical problems: Otherwise healthy except for seasonal allergies and fatty liver. ? ?Immunization History  ?Administered Date(s) Administered  ? Influenza,inj,Quad PF,6+ Mos 04/29/2020  ? Janssen (J&J) SARS-COV-2 Vaccination 07/27/2019  ? Tdap 08/30/2012  ?S[facility]rix at [facility] in [place]. ? ?Health Maintenance  ?Topic Date Due  ? MAMMOGRAM  05/31/2016  ? Zoster Vaccines- Shingrix (1 of 2) Never done  ? COVID-19 Vaccine (2 - Booster for Janssen series) 07/15/2021 (Originally 09/21/2019)  ? INFLUENZA VACCINE  07/16/2021 (Originally 11/16/2020)  ? HIV Screening  11/04/2024 (Originally 05/31/1980)  ? COLONOSCOPY (Pts [id number] Insurance coverage will need to be confirmed)  10/26/2027 (Originally 06/01/2011)  ? TETANUS/TDAP  08/31/2022  ? Hepatitis C Screening  Completed  ? HPV VACCINES  Aged Out  ? PAP SMEAR-Modifier  Discontinued  ? ?S/P hysterectomy due to heavy menses. ?Colonoscopy in 2019, polypectomy x 2. According to pt,10 years f/u was recommended.  ? ?She has no concerns today. ?For the past 2 months she has tried intermittent fasting and it has helped with abdominal bloating sensation. ? 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 ?Review of Systems  ?Constitutional:  Negative for appetite change, fatigue and fever.  ?HENT:  Negative for hearing loss, mouth sores, sore throat, trouble swallowing and voice change.   ?Eyes:  Negative for redness and visual disturbance.  ?Respiratory:  Negative for cough, shortness of breath and wheezing.   ?Cardiovascular:  Negative for chest pain and leg swelling.  ?Gastrointestinal:  Negative for  abdominal pain, nausea and vomiting.  ?     No changes in bowel habits.  ?Endocrine: Negative for cold intolerance, heat intolerance, polydipsia, polyphagia and polyuria.  ?Genitourinary:  Negative for decreased urine volume, dysuria, hematuria, vaginal bleeding and vaginal discharge.  ?Musculoskeletal:  Negative for gait problem and myalgias.  ?Skin:  Negative for color change and rash.  ?Allergic/Immunologic: Positive for environmental allergies.  ?Neurological:  Negative for syncope, weakness and headaches.  ?Hematological:  Negative for adenopathy. Does not bruise/bleed easily.  ?Psychiatric/Behavioral:  Negative for confusion. The patient is not nervous/anxious.   ?All other systems reviewed and are negative. ? ?No current outpatient medications on file prior to visit.  ? ?No current facility-administered medications on file prior to visit.  ? ?Past Medical History:  ?Diagnosis Date  ? Allergy   ? Chicken pox denies  ? ?Past Surgical History:  ?Procedure Laterality Date  ? ABDOMINAL HYSTERECTOMY    ? ?Allergies  ?Allergen Reactions  ? Pollen Extract   ? ?Family History  ?Problem Relation Age of Onset  ? Diabetes Mother   ? Cancer Father   ? Cancer Sister   ? Asthma Daughter   ? Alcohol abuse Maternal Grandfather   ? Heart attack Maternal Grandfather   ? ?Social History  ? ?Socioeconomic History  ? Marital status: Married  ?  Spouse name: Not on file  ? Number of children: Not on file  ? Years of education: Not on file  ? Highest education level: Not on file  ?Occupational History  ? Not on file  ?Tobacco Use  ? Smoking status: Never  ? Smokeless tobacco:  Never  ?Vaping Use  ? Vaping Use: Never used  ?Substance and Sexual Activity  ? Alcohol use: Never  ? Drug use: Never  ? Sexual activity: Not on file  ?Other Topics Concern  ? Not on file  ?Social History Narrative  ? Not on file  ? ?Social Determinants of Health  ? ?Financial Resource Strain: Not on file  ?Food Insecurity: Not on file  ?Transportation Needs:  Not on file  ?Physical Activity: Not on file  ?Stress: Not on file  ?Social Connections: Not on file  ? ?Vitals:  ? 06/30/21 0819  ?BP: 120/74  ?Pulse: 99  ?Resp: 12  ?Temp: 98 ?F (36.7 ?C)  ?SpO2: 99%  ? ?Body mass index is 27.66 kg/m?. ? ?Wt Readings from Last 3 Encounters:  ?06/30/21 146 lb 6 oz (66.4 kg)  ?04/29/20 146 lb (66.2 kg)  ?04/19/20 145 lb (65.8 kg)  ? ?Physical Exam ?Vitals and nursing note reviewed.  ?Constitutional:   ?   General: She is not in acute distress. ?   Appearance: She is well-developed.  ?HENT:  ?   Head: Normocephalic and atraumatic.  ?   Right Ear: Hearing, tympanic membrane, ear canal and external ear normal.  ?   Left Ear: Hearing, tympanic membrane, ear canal and external ear normal.  ?   Mouth/Throat:  ?   Mouth: Mucous membranes are moist.  ?   Pharynx: Oropharynx is clear. Uvula midline.  ?Eyes:  ?   Extraocular Movements: Extraocular movements intact.  ?   Conjunctiva/sclera: Conjunctivae normal.  ?   Pupils: Pupils are equal, round, and reactive to light.  ?Neck:  ?   Thyroid: No thyromegaly.  ?   Trachea: No tracheal deviation.  ?Cardiovascular:  ?   Rate and Rhythm: Normal rate and regular rhythm.  ?   Pulses:     ?     Dorsalis pedis pulses are 2+ on the right side and 2+ on the left side.  ?   Heart sounds: No murmur heard. ?Pulmonary:  ?   Effort: Pulmonary effort is normal. No respiratory distress.  ?   Breath sounds: Normal breath sounds.  ?Abdominal:  ?   Palpations: Abdomen is soft. There is no hepatomegaly or mass.  ?   Tenderness: There is no abdominal tenderness.  ?Genitourinary: ?   Comments: No concerns. ?Musculoskeletal:  ?   Comments: No signs of synovitis appreciated.  ?Right wrist splint.  ?Lymphadenopathy:  ?   Cervical: No cervical adenopathy.  ?   Upper Body:  ?   Right upper body: No supraclavicular adenopathy.  ?   Left upper body: No supraclavicular adenopathy.  ?Skin: ?   General: Skin is warm.  ?   Findings: No erythema or rash.  ?Neurological:  ?    General: No focal deficit present.  ?   Mental Status: She is alert and oriented to person, place, and time.  ?   Cranial Nerves: No cranial nerve deficit.  ?   Coordination: Coordination normal.  ?   Gait: Gait normal.  ?   Deep Tendon Reflexes:  ?   Reflex Scores: ?     Bicep reflexes are 2+ on the right side and 2+ on the left side. ?     Patellar reflexes are 2+ on the right side and 2+ on the left side. ?Psychiatric:     ?   Speech: Speech normal.  ?   Comments: Well groomed, good eye contact.  ? ?ASSESSMENT  AND PLAN: ? ?Laura Daniel was here today annual physical examination. ? ?Orders Placed This Encounter  ?Procedures  ? Mammogram Digital Screening  ? Comprehensive metabolic panel  ? Lipid panel  ? Hemoglobin A1c  ? ?Lab Results  ?Component Value Date  ? HGBA1C 6.0 06/30/2021  ? ?.lastre ?Lab Results  ?Component Value Date  ? ALT 59 (H) 06/30/2021  ? AST 23 06/30/2021  ? ALKPHOS 73 06/30/2021  ? BILITOT 0.6 06/30/2021  ? ?Lab Results  ?Component Value Date  ? CHOL 199 06/30/2021  ? HDL 63.10 06/30/2021  ? LDLCALC 113 (H) 06/30/2021  ? TRIG 113.0 06/30/2021  ? CHOLHDL 3 06/30/2021  ? ?Routine general medical examination at a health care facility ?We discussed the importance of regular physical activity and healthy diet for prevention of chronic illness and/or complications. ?Preventive guidelines reviewed. ?Vaccination up to date. ?We will obtain copy of colonoscopy report, I asked her to sign a release form. ?Mammogram order placed. ?Next CPE in a year. ?The 10-year ASCVD risk score (Arnett DK, et al., 2019) is: 1.5% ?  Values used to calculate the score: ?    Age: 63 years ?    Sex: Female ?    Is Non-Hispanic African American: No ?    Diabetic: No ?    Tobacco smoker: No ?    Systolic Blood Pressure: 120 mmHg ?    Is BP treated: No ?    HDL Cholesterol: 63.1 mg/dL ?    Total Cholesterol: 199 mg/dL ? ?Screening for lipoid disorders ?-     Lipid panel ? ?Screening for endocrine, metabolic and immunity  disorder ?-     Hemoglobin A1c ?-     Comprehensive metabolic panel ? ?Encounter for screening for malignant neoplasm of breast, unspecified screening modality ?-     Mammogram Digital Screening; Future

## 2021-06-30 ENCOUNTER — Encounter: Payer: Self-pay | Admitting: Family Medicine

## 2021-06-30 ENCOUNTER — Ambulatory Visit (INDEPENDENT_AMBULATORY_CARE_PROVIDER_SITE_OTHER): Payer: BC Managed Care – PPO | Admitting: Family Medicine

## 2021-06-30 VITALS — BP 120/74 | HR 99 | Temp 98.0°F | Resp 12 | Ht 61.0 in | Wt 146.4 lb

## 2021-06-30 DIAGNOSIS — R7303 Prediabetes: Secondary | ICD-10-CM

## 2021-06-30 DIAGNOSIS — Z13228 Encounter for screening for other metabolic disorders: Secondary | ICD-10-CM | POA: Diagnosis not present

## 2021-06-30 DIAGNOSIS — Z0000 Encounter for general adult medical examination without abnormal findings: Secondary | ICD-10-CM | POA: Diagnosis not present

## 2021-06-30 DIAGNOSIS — Z13220 Encounter for screening for lipoid disorders: Secondary | ICD-10-CM

## 2021-06-30 DIAGNOSIS — Z130 Encounter for screening for diseases of the blood and blood-forming organs and certain disorders involving the immune mechanism: Secondary | ICD-10-CM

## 2021-06-30 DIAGNOSIS — R7401 Elevation of levels of liver transaminase levels: Secondary | ICD-10-CM

## 2021-06-30 DIAGNOSIS — Z1329 Encounter for screening for other suspected endocrine disorder: Secondary | ICD-10-CM

## 2021-06-30 DIAGNOSIS — Z1239 Encounter for other screening for malignant neoplasm of breast: Secondary | ICD-10-CM

## 2021-06-30 DIAGNOSIS — Z1231 Encounter for screening mammogram for malignant neoplasm of breast: Secondary | ICD-10-CM

## 2021-06-30 LAB — COMPREHENSIVE METABOLIC PANEL
ALT: 59 U/L — ABNORMAL HIGH (ref 0–35)
AST: 23 U/L (ref 0–37)
Albumin: 4.6 g/dL (ref 3.5–5.2)
Alkaline Phosphatase: 73 U/L (ref 39–117)
BUN: 20 mg/dL (ref 6–23)
CO2: 26 mEq/L (ref 19–32)
Calcium: 9.8 mg/dL (ref 8.4–10.5)
Chloride: 104 mEq/L (ref 96–112)
Creatinine, Ser: 0.59 mg/dL (ref 0.40–1.20)
GFR: 101.7 mL/min (ref >60.00)
Glucose, Bld: 101 mg/dL — ABNORMAL HIGH (ref 70–99)
Potassium: 3.6 mEq/L (ref 3.5–5.1)
Sodium: 139 mEq/L (ref 135–145)
Total Bilirubin: 0.6 mg/dL (ref 0.2–1.2)
Total Protein: 7.3 g/dL (ref 6.0–8.3)

## 2021-06-30 LAB — LIPID PANEL
Cholesterol: 199 mg/dL (ref 0–200)
HDL: 63.1 mg/dL (ref >39.00)
LDL Cholesterol: 113 mg/dL — ABNORMAL HIGH (ref 0–99)
NonHDL: 135.79
Total CHOL/HDL Ratio: 3
Triglycerides: 113 mg/dL (ref 0.0–149.0)
VLDL: 22.6 mg/dL (ref 0.0–40.0)

## 2021-06-30 LAB — HEMOGLOBIN A1C: Hgb A1c MFr Bld: 6 % (ref 4.6–6.5)

## 2021-06-30 NOTE — Patient Instructions (Addendum)
A few things to remember from today's visit: ? ?Routine general medical examination at a health care facility ? ?Screening for lipoid disorders - Plan: Lipid panel ? ?Screening for endocrine, metabolic and immunity disorder - Plan: Comprehensive metabolic panel, Hemoglobin A1c ? ?Encounter for screening mammogram for malignant neoplasm of breast ? ?Encounter for screening for malignant neoplasm of breast, unspecified screening modality - Plan: Mammogram Digital Screening ? ?Colon cancer screening ? ?If you need refills please call your pharmacy. ?Do not use My Chart to request refills or for acute issues that need immediate attention. ? ?Please be sure medication list is accurate. ?If a new problem present, please set up appointment sooner than planned today. ? ?Mantenimiento de [employer] mujeres ?Health Maintenance, Female ?Adoptar un estilo de vida saludable y recibir atenci?n preventiva son importantes para promover la salud y Counsellor. Consulte al m?dico sobre: ?El esquema adecuado para [place] pruebas y ex?menes peri?dicos. ?Cosas que puede hacer por su cuenta para prevenir enfermedades y Bryan sano. ??Qu? debo saber sobre la dieta, el peso y el ejercicio? ?Consuma una dieta saludable ? ?Consuma una dieta que incluya muchas verduras, frutas, productos l?cteos con bajo contenido de grasa y prote?nas magras. ?No consuma muchos alimentos ricos en grasas s?lidas, az?cares agregados o sodio. ?Mantenga un peso saludable ?El ?ndice de masa muscular [facility]) se [place] para identificar problemas de peso. Proporciona una estimaci?n de la grasa corporal bas?ndose en el peso y la altura. Su m?dico puede ayudarle a Engineer, site [facility]MC y a Personnel officer o Pharmacologist un peso saludable. ?Haga ejercicio con regularidad ?Haga ejercicio con regularidad. Esta es una de las pr?cticas m?s importantes que puede hacer por su salud. La mayor?a de los adultos deben seguir estas pautas: ?Realizar, al menos, 150 minutos de [place]  f?sica por semana. El ejercicio debe aumentar la frecuencia card?aca y Media planner transpirar (ejercicio de intensidad moderada). ?Hacer ejercicios de fortalecimiento por lo [employer] por semana. Agregue esto a su plan de ejercicio de intensidad moderada. ?Pase menos tiempo sentada. Incluso la actividad f?sica ligera puede ser beneficiosa. ?Controle sus niveles de colesterol y l?pidos en la sangre ?Comience a realizarse an?lisis de l?pidos y Oncologist en la sangre a los 20 a?os y luego rep?talos cada 5 a?os. ?H?gase controlar los niveles de colesterol con mayor frecuencia si: ?Sus niveles de l?pidos y colesterol son altos. ?Es mayor de 40 a?os. ?Presenta un alto riesgo de padecer enfermedades card?acas. ??Qu? debo saber sobre las pruebas de detecci?n del c?ncer? ?Seg?n su historia cl?nica y sus antecedentes familiares, es posible que deba realizarse pruebas de detecci?n del c?ncer en diferentes edades. Esto puede incluir pruebas de detecci?n de lo siguiente: ?C?ncer de mama. ?C?ncer de cuello uterino. ?C?ncer colorrectal. ?C?ncer de piel. ?C?ncer de pulm?n. ??Qu? debo saber sobre la enfermedad card?aca, la diabetes y la hipertensi?n arterial? ?Presi?n arterial y enfermedad card?aca ?La hipertensi?n arterial causa enfermedades card?acas y [place] el riesgo de accidente cerebrovascular. Es m?s probable que Immunologist en las personas que tienen lecturas de presi?n arterial alta o tienen sobrepeso. ?H?gase controlar la presi?n arterial: ?Cada 3 a 5 a?os si tiene entre 18 y 67 a?os. ?Todos los a?os si es mayor de 40 a?os. ?Diabetes ?Real?cese ex?menes de detecci?n de la diabetes con regularidad. Este an?lisis revisa el nivel de az?car en la sangre en [place]. H?gase las pruebas de detecci?n: ?Cada tres a?os despu?s de los 40 a?os de edad si tiene un peso normal y un bajo riesgo de padecer diabetes. ?  Con m?s frecuencia y a partir de [place] edad inferior si tiene sobrepeso o un alto riesgo de padecer diabetes. ??Qu?  debo saber sobre la prevenci?n de infecciones? ?Hepatitis B ?Si tiene un riesgo m?s alto de Primary school teacher hepatitis B, debe someterse a un examen de detecci?n de este virus. Hable con el m?dico para averiguar si tiene riesgo de contraer la infecci?n por hepatitis B. ?Hepatitis C ?Se recomienda el an?lisis a: ?[employer] 1944 y 1965. ?Todas las personas que tengan un riesgo de haber contra?do hepatitis C. ?Enfermedades de transmisi?n sexual (ETS) ?H?gase las pruebas de detecci?n de ITS, incluidas la gonorrea y la clamidia, si: ?Es sexualmente activa y es menor de 24 a?os. ?Es mayor de 24 a?os, y el m?dico le informa que corre riesgo de tener este tipo de infecciones. ?La actividad sexual ha cambiado desde que le hicieron la ?ltima prueba de detecci?n y tiene un riesgo mayor de tener clamidia o [place]. Preg?ntele al m?dico si usted tiene riesgo. ?Preg?ntele al m?dico si usted tiene un alto riesgo de Primary school teacher VIH. El m?dico tambi?n puede recomendarle un medicamento recetado para ayudar a evitar la infecci?n por el VIH. Si elige tomar medicamentos para prevenir el VIH, primero debe [employer] an?lisis de detecci?n del VIH. Luego debe hacerse an?lisis cada 3 meses mientras est? tomando los [employer]. ?Embarazo ?Si est? por dejar de Armed forces training and education officer (fase premenop?usica) y usted puede quedar [place], busque asesoramiento antes de [place]. ?Tome de 400 a 800 microgramos (mcg) de ?cido f?lico todos los d?as si queda embarazada. ?Pida m?todos de control de la natalidad (anticonceptivos) si desea evitar un embarazo no deseado. ?Osteoporosis y menopausia ?La osteoporosis es una enfermedad en la que los huesos pierden los minerales y la fuerza por el avance de la edad. El resultado pueden ser fracturas en los [place]. Si tiene 65 a?os o m?s, o si est? en riesgo de sufrir osteoporosis y fracturas, pregunte a su m?dico si debe: ?Hacerse pruebas de detecci?n de p?rdida ?sea. ?Tomar un suplemento de calcio o  de vitamina D para reducir el riesgo de fracturas. ?Recibir terapia de reemplazo hormonal (TRH) para tratar los s?ntomas de la menopausia. ?Siga estas indicaciones en su casa: ?Consumo de alcohol ?No beba alcohol si: ?Su m?dico le indica no hacerlo. ?Est? embarazada, puede estar embarazada o est? tratando de quedar embarazada. ?Si bebe alcohol: ?Limite la cantidad que bebe a lo siguiente: ?De 0 a 1 bebida por d?a. ?Sepa cu?nta cantidad de alcohol hay en las bebidas que toma. En los [address], una medida equivale a una botella de cerveza de 12 oz (355 ml), un vaso de vino de 5 oz (148 ml) o un vaso de una bebida alcoh?lica de alta graduaci?n de 1? oz (44 ml). ?Estilo de vida ?No consuma ning?n producto que contenga nicotina o tabaco. Estos productos incluyen cigarrillos, tabaco para mascar y aparatos de vapeo, como los cigarrillos electr?nicos. Si necesita ayuda para dejar de consumir estos productos, consulte al m?dico. ?No consuma drogas. ?No comparta agujas. ?Solicite ayuda a su m?dico si necesita apoyo o informaci?n para abandonar las drogas. ?Indicaciones generales ?Real?cese los estudios de rutina de [address], dentales y de Wellsite geologist. ?Mant?ngase al d?a con las vacunas. ?Inf?rmele a su m?dico si: ?Se siente deprimida con frecuencia. ?Alguna vez ha sido v?ctima de Medical sales representative o no se siente seguro en su casa. ?Resumen ?Adoptar un estilo de vida saludable y recibir atenci?n preventiva son importantes para promover la salud y Counsellor. ?Siga las instrucciones  del m?dico acerca de una dieta saludable, el ejercicio y la realizaci?n de pruebas o ex?menes para Hotel managerdetectar enfermedades. ?Siga las instrucciones del m?dico con respecto al control del colesterol y la presi?n arterial. ?Esta informaci?n no tiene Theme park managercomo fin reemplazar el consejo del m?dico. Aseg?rese de hacerle al m?dico cualquier pregunta que tenga. ?Document Revised: 09/10/2020 Document Reviewed: 09/10/2020 ?Elsevier Patient Education ? 2022 Elsevier  Inc. ? ?

## 2021-07-07 ENCOUNTER — Ambulatory Visit
Admission: RE | Admit: 2021-07-07 | Discharge: 2021-07-07 | Disposition: A | Discharge status: Home / Self Care | Payer: BC Managed Care – PPO | Source: Ambulatory Visit | Attending: Family Medicine | Admitting: Family Medicine

## 2021-07-07 ENCOUNTER — Other Ambulatory Visit: Payer: Self-pay

## 2021-07-07 DIAGNOSIS — R7401 Elevation of levels of liver transaminase levels: Secondary | ICD-10-CM

## 2021-07-13 ENCOUNTER — Other Ambulatory Visit: Payer: Self-pay | Admitting: Family Medicine

## 2021-07-13 DIAGNOSIS — Z1231 Encounter for screening mammogram for malignant neoplasm of breast: Secondary | ICD-10-CM

## 2021-07-22 ENCOUNTER — Ambulatory Visit
Admission: RE | Admit: 2021-07-22 | Discharge: 2021-07-22 | Disposition: A | Discharge status: Home / Self Care | Payer: BC Managed Care – PPO | Source: Ambulatory Visit | Attending: Family Medicine | Admitting: Family Medicine

## 2021-07-22 DIAGNOSIS — Z1231 Encounter for screening mammogram for malignant neoplasm of breast: Secondary | ICD-10-CM

## 2021-09-20 ENCOUNTER — Ambulatory Visit: Payer: BC Managed Care – PPO | Attending: Orthopedic Surgery | Admitting: Occupational Therapy

## 2021-09-20 DIAGNOSIS — M25541 Pain in joints of right hand: Secondary | ICD-10-CM | POA: Diagnosis present

## 2021-09-20 DIAGNOSIS — R278 Other lack of coordination: Secondary | ICD-10-CM | POA: Diagnosis present

## 2021-09-20 DIAGNOSIS — M6281 Muscle weakness (generalized): Secondary | ICD-10-CM | POA: Insufficient documentation

## 2021-09-20 DIAGNOSIS — M79631 Pain in right forearm: Secondary | ICD-10-CM | POA: Diagnosis present

## 2021-09-20 NOTE — Therapy (Signed)
OUTPATIENT OCCUPATIONAL THERAPY ORTHO EVALUATION  Patient Name: Laura Daniel MRN: [id number] DOB:February 07, 1966, 56 y.o., female Today's Date: 09/20/2021  PCP: [place], Betty G, MD REFERRING PROVIDER: Vassie Loll, MD   OT End of Session - 09/20/21 1406     Visit Number 1    Number of Visits 17    Date for OT Re-Evaluation 11/19/21    Authorization Type BCBS    OT Start Time 1321    OT Stop Time 1406    OT Time Calculation (min) 45 min             Past Medical History:  Diagnosis Date   Allergy    Chicken pox denies   Past Surgical History:  Procedure Laterality Date   ABDOMINAL HYSTERECTOMY     There are no problems to display for this patient.   ONSET DATE: 06/29/2021   REFERRING DIAG: M65.4 (ICD-10-CM) - Radial styloid tenosynovitis (de quervain)  THERAPY DIAG:  Pain in joint of right hand  Pain in right forearm  Muscle weakness (generalized)  Other lack of coordination  Rationale for Evaluation and Treatment Rehabilitation  SUBJECTIVE:   SUBJECTIVE STATEMENT: Pt reports certain movements cause pain, specifically tasks that require turning wrist: brushing hair, cooking, picking up something heavy, when getting dressed.  Pt reports wearing a brace at nighttime for positioning and to decrease pain.  Noted the pain started when carrying suitcases when they were traveling.   Pt accompanied by:  daughter - Kathey Spicer  PERTINENT HISTORY: injection in March for tenosynovitis, Otherwise healthy except for seasonal allergies and fatty liver.  PRECAUTIONS: None  WEIGHT BEARING RESTRICTIONS No  PAIN:  Are you having pain? Yes: NPRS scale: 1/10 Pain location: R thumb and entire forearm  Pain description: radiating, stinging Aggravating factors: movement, picking up heavy items, dressing Relieving factors: rest  FALLS: Has patient fallen in last 6 months? No  LIVING ENVIRONMENT: Lives with: lives with their family Lives in: House/apartment Stairs: Yes:  External: 1 steps Has following equipment at home: None  PLOF: Independent  PATIENT GOALS to get rid of the pain  OBJECTIVE:   HAND DOMINANCE: Right  ADLs: Transfers/ambulation related to ADLs: Mod I Eating: difficulty with cutting foods occasionally Grooming: able to complete tasks with use of LUE due to too much pain in RUE with things like brushing teeth, brushing hair, and applying make up.  UB Dressing: pain with dressing, increased difficulty with fastening bra LB Dressing: pain with pulling pants up/down, is using LUE as primary hand to decrease pain Toileting: difficulty with hygiene and clothing management Bathing: pain with bathing Tub Shower transfers: Mod I Equipment:  hand held shower head  FUNCTIONAL OUTCOME MEASURES: Quick Dash: 63.63  UPPER EXTREMITY ROM     Active ROM Right eval Left eval  Shoulder flexion WFL   Shoulder abduction [facility]   Shoulder adduction    Shoulder extension    Shoulder internal rotation [facility]   Shoulder external rotation WFL   Elbow flexion WFL   Elbow extension WFL   Wrist flexion WFL (pain)   Wrist extension WFL (pain)   Wrist ulnar deviation    Wrist radial deviation Pain limiting full ROM   Wrist pronation    Wrist supination    (Blank rows = not tested)    UPPER EXTREMITY MMT:     MMT Right eval Left eval  Shoulder flexion 4+/[address]  Shoulder abduction    Shoulder adduction    Shoulder extension  Shoulder internal rotation    Shoulder external rotation    Middle trapezius    Lower trapezius    Elbow flexion 4/5 WFL  Elbow extension 3/5 (pain) WFL  Wrist flexion    Wrist extension    Wrist ulnar deviation    Wrist radial deviation    Wrist pronation    Wrist supination    (Blank rows = not tested)  HAND FUNCTION: Grip strength: Right: 44 lbs; Left: 52 lbs, Lateral pinch: Right: 15 lbs, Left: 16 lbs, and 3 point pinch: Right: 8 lbs, Left: 12 lbs Pain with 3 point pinch  COORDINATION: 9 Hole Peg test:  Right: 26.94 sec; Left: 23.41 sec Box and Blocks:  Right 51 blocks, Left 56 blocks Reports pain 1/10 with activity  SENSATION: WFL   PATIENT EDUCATION: Education details: Educated on role and purpose of OT as well as potential interventions and goals for therapy based on initial evaluation findings. Person educated: Patient and Child(ren) Education method: Explanation Education comprehension: verbalized understanding   HOME EXERCISE PROGRAM: TBD  GOALS: Goals reviewed with patient? No  SHORT TERM GOALS: Target date:  10/22/2021     STG/ LTG  Status:  1 Pt will be Independent with HEP for strengthening and decrease pain in joints of finger/hands to increase independence with ADLs and IADLs  Baseline:  INITIAL  2 Pt will verbalize understanding of adapted strategies and AE as needed to maximize safety and Independence with ADLs/ IADLs . Baseline:  INITIAL  3 Pt will be able to utilize RUE at diminished level during grooming tasks without increase in pain > 2/10 on pain scale. Baseline: Pt reports unable to utilize RUE during self-care tasks INITIAL  4 Pt will be able to open containers as needed for ADLs and IADLs with use of RUE at diminished level and/or with use of AE as needed. Baseline:  INITIAL  5 Pt will demonstrate improved UE functional use for ADLs as evidenced by increasing box/ blocks score by 3 blocks with RUE. Baseline: INITIAL   LONG TERM GOALS: Target date:  11/19/2021     STG/ LTG  Status:  1 Pt will verbalize and demonstrate functional improvements in RUE with score </= to 50 on Quick Dash. Baseline: 63.63 INITIAL  2 Pt will independent with splint wear and care (PRN). Baseline: need for splint TBD INITIAL  3 Pt will report improved functional use of dominant RUE during self-care tasks without increase in pain >2/10 (from starting pain report) on pain scale. Baseline:  INITIAL  4 Pt will report improved ability to cut food without increase of pain > 2/10 on  pain scale. Baseline:  INITIAL  5 Pt will be able to retrieve items from refrigerator with use of RUE at dominant level without increase in pain >3/10 on pain scale. Baseline:  INITIAL   ASSESSMENT:  CLINICAL IMPRESSION: Patient is a 56 y.o. female who was seen today for occupational therapy evaluation for pain in dominant RUE during ADLs and IADLs due to radial styloid tenosynovitis (de quervain).  Pt reports pain in dominant RUE radiates through arm when engaging in self-care tasks of bathing, dressing, toileting, and when completing home making tasks of washing dishes, cooking, other cleaning tasks, and leisure tasks of sewing.  Pt lives with family (spouse and adult children) in a single level home with 1 small step to enter. Pt will benefit from skilled occupational therapy services to address strength and coordination, ROM, pain management, GM/FM control, introduction of compensatory strategies/AE  prn, and implementation of an HEP to improve participation and safety during ADLs, IADLs, and leisure pursuits.   PERFORMANCE DEFICITS in functional skills including ADLs, IADLs, coordination, dexterity, ROM, strength, pain, [place], GMC, and UE functional use.  IMPAIRMENTS are limiting patient from ADLs, IADLs, rest and sleep, and leisure.   COMORBIDITIES may have co-morbidities  that affects occupational performance. Patient will benefit from skilled OT to address above impairments and improve overall function.  MODIFICATION OR ASSISTANCE TO COMPLETE EVALUATION: No modification of tasks or assist necessary to complete an evaluation.  OT OCCUPATIONAL PROFILE AND HISTORY: Detailed assessment: Review of records and additional review of physical, cognitive, psychosocial history related to current functional performance.  CLINICAL DECISION MAKING: LOW - limited treatment options, no task modification necessary  REHAB POTENTIAL: Good  EVALUATION COMPLEXITY: Low     PLAN: OT FREQUENCY:  2x/week  OT DURATION: 8 weeks  PLANNED INTERVENTIONS: self care/ADL training, therapeutic exercise, therapeutic activity, neuromuscular re-education, manual therapy, splinting, electrical stimulation, ultrasound, iontophoresis, fluidotherapy, compression bandaging, moist heat, cryotherapy, patient/family education, and DME and/or AE instructions  RECOMMENDED OTHER SERVICES: N/A  CONSULTED AND AGREED WITH PLAN OF CARE: Patient and family member/caregiver  PLAN FOR NEXT SESSION: Initiate HEP for tenosynovitis, grip and coordination, assess splint if pt brings next visit   [place], Robert [place], OTR/L 09/20/2021, 2:33 PM

## 2021-09-24 ENCOUNTER — Ambulatory Visit: Payer: BC Managed Care – PPO | Admitting: Occupational Therapy

## 2021-09-24 DIAGNOSIS — M79631 Pain in right forearm: Secondary | ICD-10-CM

## 2021-09-24 DIAGNOSIS — R278 Other lack of coordination: Secondary | ICD-10-CM

## 2021-09-24 DIAGNOSIS — M25541 Pain in joints of right hand: Secondary | ICD-10-CM

## 2021-09-24 DIAGNOSIS — M6281 Muscle weakness (generalized): Secondary | ICD-10-CM

## 2021-09-24 NOTE — Therapy (Signed)
OUTPATIENT OCCUPATIONAL THERAPY Treatment   Patient Name: Laura Daniel MRN: [id number] DOB:03-22-1966, 56 y.o., female Today's Date: 09/24/2021  PCP: [place], Betty G, MD REFERRING PROVIDER: Jannette Fogo, MD   OT End of Session - 09/24/21 [phone]     Visit Number 2    Number of Visits 17    Date for OT Re-Evaluation 11/19/21    Authorization Type BCBS    OT Start Time 0847    OT Stop Time 0930    OT Time Calculation (min) 43 min    Activity Tolerance Patient tolerated treatment well    Behavior During Therapy WFL for tasks assessed/performed              Past Medical History:  Diagnosis Date   Allergy    Chicken pox denies   Past Surgical History:  Procedure Laterality Date   ABDOMINAL HYSTERECTOMY     There are no problems to display for this patient.   ONSET DATE: 06/29/2021   REFERRING DIAG: M65.4 (ICD-10-CM) - Radial styloid tenosynovitis (de quervain)  THERAPY DIAG:  Pain in joint of right hand  Pain in right forearm  Muscle weakness (generalized)  Other lack of coordination  Rationale for Evaluation and Treatment Rehabilitation  SUBJECTIVE:   SUBJECTIVE STATEMENT: "I have no pain today.  I took a shower and got dressed, no pain."  Pt accompanied by: self   PAIN:  Are you having pain? Yes: NPRS scale: 2-3/10 Pain location: R thumb and entire forearm  Pain description: radiating, stinging Aggravating factors: movement, picking up heavy items, dressing Relieving factors: rest  PATIENT GOALS to get rid of the pain  OBJECTIVE:   Today's Treatment: Pt brought in brace, able to apply and remove.  Pt reports wearing only at bed time right now as pain is worse at night/when sleeping. Therapist directed pt in exercises with and without resistance.  Pt reports mild increase in pain along radial distribution.  Unable to utilize weight during wrist flexion, extension, and radial deviation. Pt reports increased pain with radial deviation and finger  extension/thumb abduction with rubber band. Seated Composite Thumb Flexion AROM  Thumb Opposition   Thumb Opposition Thumb Radial Adduction with Thumb Flexion AROM on Table  Seated Wrist Flexion with Dumbbell   Seated Wrist Extension with Dumbbell Seated Wrist Radial Deviation with Dumbbell   Resisted Finger Extension and Thumb Abduction   Thumb Strengthening Stabilization CMC Putty Squeezes   Finger Lumbricals with Putty   Key Pinch with Putty  PATIENT EDUCATION: Education details: Educated on modifying stretch within pain tolerance and keeping movements even, slow, and smooth. Person educated: Patient Education method: Explanation, Demonstration, and Handouts Education comprehension: verbalized understanding   HOME EXERCISE PROGRAM: [phone]  GOALS: Goals reviewed with patient? Yes  SHORT TERM GOALS: Target date:  10/22/2021     STG/ LTG  Status:  1 Pt will be Independent with HEP for strengthening and decrease pain in joints of finger/hands to increase independence with ADLs and IADLs  Baseline:  On going  2 Pt will verbalize understanding of adapted strategies and AE as needed to maximize safety and Independence with ADLs/ IADLs . Baseline:  On going  3 Pt will be able to utilize RUE at diminished level during grooming tasks without increase in pain > 2/10 on pain scale. Baseline: Pt reports unable to utilize RUE during self-care tasks On going  4 Pt will be able to open containers as needed for ADLs and IADLs with use of RUE at  diminished level and/or with use of AE as needed. Baseline:  On going  5 Pt will demonstrate improved UE functional use for ADLs as evidenced by increasing box/ blocks score by 3 blocks with RUE. Baseline: On going   LONG TERM GOALS: Target date:  11/19/2021     STG/ LTG  Status:  1 Pt will verbalize and demonstrate functional improvements in RUE with score </= to 50 on Quick Dash. Baseline: 63.63 On going  2 Pt will independent with splint wear  and care (PRN). Baseline: need for splint TBD On going  3 Pt will report improved functional use of dominant RUE during self-care tasks without increase in pain >2/10 (from starting pain report) on pain scale. Baseline:  On going  4 Pt will report improved ability to cut food without increase of pain > 2/10 on pain scale. Baseline:  On going  5 Pt will be able to retrieve items from refrigerator with use of RUE at dominant level without increase in pain >3/10 on pain scale. Baseline:  On going   ASSESSMENT:  CLINICAL IMPRESSION: Session with focus on initiating HEP for tenosynovitis.  Pt reports no pain in R hand upon arrival, reporting improved ability to engage in all self-care tasks this morning prior to session.  Pt tolerated 1 set of 10 each exercise with reports of increased pain along radial distribution with wrist flexion, extension, and radial deviation.  Pt pleased with improvements in pain, however does report increased pain after exercises.    PERFORMANCE DEFICITS in functional skills including ADLs, IADLs, coordination, dexterity, ROM, strength, pain, FMC, GMC, and UE functional use.  IMPAIRMENTS are limiting patient from ADLs, IADLs, rest and sleep, and leisure.   COMORBIDITIES may have co-morbidities  that affects occupational performance. Patient will benefit from skilled OT to address above impairments and improve overall function.  MODIFICATION OR ASSISTANCE TO COMPLETE EVALUATION: No modification of tasks or assist necessary to complete an evaluation.  OT OCCUPATIONAL PROFILE AND HISTORY: Detailed assessment: Review of records and additional review of physical, cognitive, psychosocial history related to current functional performance.  CLINICAL DECISION MAKING: LOW - limited treatment options, no task modification necessary  REHAB POTENTIAL: Good  EVALUATION COMPLEXITY: Low     PLAN: OT FREQUENCY: 2x/week  OT DURATION: 8 weeks  PLANNED INTERVENTIONS: self  care/ADL training, therapeutic exercise, therapeutic activity, neuromuscular re-education, manual therapy, splinting, electrical stimulation, ultrasound, iontophoresis, fluidotherapy, compression bandaging, moist heat, cryotherapy, patient/family education, and DME and/or AE instructions  RECOMMENDED OTHER SERVICES: N/A  CONSULTED AND AGREED WITH PLAN OF CARE: Patient and family member/caregiver  PLAN FOR NEXT SESSION: Review HEP for tenosynovitis, grip and coordination, engage in functional tasks and problem solve improved ROM.   Rosalio Loud, OTR/L 09/24/2021, 8:53 AM

## 2021-10-01 ENCOUNTER — Ambulatory Visit: Payer: BC Managed Care – PPO | Admitting: Occupational Therapy

## 2021-10-01 DIAGNOSIS — M79631 Pain in right forearm: Secondary | ICD-10-CM

## 2021-10-01 DIAGNOSIS — R278 Other lack of coordination: Secondary | ICD-10-CM

## 2021-10-01 DIAGNOSIS — M25541 Pain in joints of right hand: Secondary | ICD-10-CM

## 2021-10-01 DIAGNOSIS — M6281 Muscle weakness (generalized): Secondary | ICD-10-CM

## 2021-10-01 NOTE — Therapy (Signed)
OUTPATIENT OCCUPATIONAL THERAPY Treatment   Patient Name: Laura Daniel MRN: [id number] DOB:1965/10/05, 56 y.o., female Today's Date: 10/01/2021  PCP: [place], Betty G, MD REFERRING PROVIDER: Jannette Fogo, MD   OT End of Session - 10/01/21 1109     Visit Number 3    Number of Visits 17    Date for OT Re-Evaluation 11/19/21    Authorization Type [employer]    OT Start Time 1105    OT Stop Time 1148    OT Time Calculation (min) 43 min    Activity Tolerance Patient tolerated treatment well    Behavior During Therapy WFL for tasks assessed/performed               Past Medical History:  Diagnosis Date   Allergy    Chicken pox denies   Past Surgical History:  Procedure Laterality Date   ABDOMINAL HYSTERECTOMY     There are no problems to display for this patient.   ONSET DATE: 06/29/2021   REFERRING DIAG: M65.4 (ICD-10-CM) - Radial styloid tenosynovitis (de quervain)  THERAPY DIAG:  Pain in joint of right hand  Pain in right forearm  Muscle weakness (generalized)  Other lack of coordination  Rationale for Evaluation and Treatment Rehabilitation  SUBJECTIVE:   SUBJECTIVE STATEMENT: "I had so much pain Wednesday that I had to take pain reliever and was unable to continue with exercises."  Pt accompanied by: self   PAIN:  Are you having pain? No  PATIENT GOALS to get rid of the pain  OBJECTIVE:   Today's Treatment: Pipe tree puzzle activity with focus on picking up and manipulating PVC pipe pieces to challenge various grips and wrist movements.  Pt demonstrating increased difficulty and pain with wrist extension and radial deviation. Therapist educated on possibility of benefit from custom fabricated splint to allow for increased support and positioning as well as allowing for adequate positioning as needed for healing and diminishing overuse. Therapist modified HEP to remove repetitive wrist flexion, extension, and radial/ulnar deviation.  Educated on  importance of controlled stretch and quality ROM vs repetition and strain on movement.  Therapist provided mod demonstration cues for improved technique with Thumb Opposition, Thumb Radial Adduction with Thumb Flexion AROM on Table for improved mobility at thumb.  Therapist provided demonstration and cues for improved technique with key pinch with putty and added thumb radial abduction with putty (instead of resisted thumb abduction with rubber band).   PATIENT EDUCATION: Education details: Educated on modifying stretch within pain tolerance and keeping movements even, slow, and smooth.  Mod demonstration and education on improved technique for putty exercises and thumb opposition. Person educated: Patient Education method: Explanation, Demonstration, and Handouts Education comprehension: verbalized understanding   HOME EXERCISE PROGRAM: [phone]  GOALS: Goals reviewed with patient? Yes  SHORT TERM GOALS: Target date:  10/22/2021     STG/ LTG  Status:  1 Pt will be Independent with HEP for strengthening and decrease pain in joints of finger/hands to increase independence with ADLs and IADLs  Baseline:  On going  2 Pt will verbalize understanding of adapted strategies and AE as needed to maximize safety and Independence with ADLs/ IADLs . Baseline:  On going  3 Pt will be able to utilize RUE at diminished level during grooming tasks without increase in pain > 2/10 on pain scale. Baseline: Pt reports unable to utilize RUE during self-care tasks On going  4 Pt will be able to open containers as needed for ADLs and IADLs with  use of RUE at diminished level and/or with use of AE as needed. Baseline:  On going  5 Pt will demonstrate improved UE functional use for ADLs as evidenced by increasing box/ blocks score by 3 blocks with RUE. Baseline: On going   LONG TERM GOALS: Target date:  11/19/2021     STG/ LTG  Status:  1 Pt will verbalize and demonstrate functional improvements in RUE with  score </= to 50 on Quick Dash. Baseline: 63.63 On going  2 Pt will independent with splint wear and care (PRN). Baseline: need for splint TBD On going  3 Pt will report improved functional use of dominant RUE during self-care tasks without increase in pain >2/10 (from starting pain report) on pain scale. Baseline:  On going  4 Pt will report improved ability to cut food without increase of pain > 2/10 on pain scale. Baseline:  On going  5 Pt will be able to retrieve items from refrigerator with use of RUE at dominant level without increase in pain >3/10 on pain scale. Baseline:  On going   ASSESSMENT:  CLINICAL IMPRESSION: Session with focus on reviewing HEP and providing modifications to stretches and ensuring proper technique. Pt's daughter present during session assisting with interpretation as pt does understand English, but is not a native speaker. Pt reports increased pain with exercises, throughout session gleaned that pt may have been overdoing the exercises. Therefore focus placed on quality of movement and sustained hold for stretch vs focus on repetition.   Pt able to engage in table top task, however reports mild pain along radial distribution with wrist flexion and radial deviation.  Therapist educated on possibility of benefit from custom fabricated splint to allow for increased support and positioning as well as allowing for adequate positioning as needed for healing and diminishing overuse - plan to assess during next session.  PERFORMANCE DEFICITS in functional skills including ADLs, IADLs, coordination, dexterity, ROM, strength, pain, FMC, GMC, and UE functional use.  IMPAIRMENTS are limiting patient from ADLs, IADLs, rest and sleep, and leisure.   COMORBIDITIES may have co-morbidities  that affects occupational performance. Patient will benefit from skilled OT to address above impairments and improve overall function.  MODIFICATION OR ASSISTANCE TO COMPLETE EVALUATION: No  modification of tasks or assist necessary to complete an evaluation.  OT OCCUPATIONAL PROFILE AND HISTORY: Detailed assessment: Review of records and additional review of physical, cognitive, psychosocial history related to current functional performance.  CLINICAL DECISION MAKING: LOW - limited treatment options, no task modification necessary  REHAB POTENTIAL: Good  EVALUATION COMPLEXITY: Low     PLAN: OT FREQUENCY: 2x/week  OT DURATION: 8 weeks  PLANNED INTERVENTIONS: self care/ADL training, therapeutic exercise, therapeutic activity, neuromuscular re-education, manual therapy, splinting, electrical stimulation, ultrasound, iontophoresis, fluidotherapy, compression bandaging, moist heat, cryotherapy, patient/family education, and DME and/or AE instructions  RECOMMENDED OTHER SERVICES: N/A  CONSULTED AND AGREED WITH PLAN OF CARE: Patient and family member/caregiver  PLAN FOR NEXT SESSION: Assess for custom splint need and fabricate if needed. Review HEP for tenosynovitis, grip and coordination, engage in functional tasks and problem solve improved ROM.   Rosalio Loud, OTR/L 10/01/2021, 12:13 PM

## 2021-10-05 ENCOUNTER — Ambulatory Visit: Payer: BC Managed Care – PPO | Admitting: Occupational Therapy

## 2021-10-05 ENCOUNTER — Encounter: Payer: Self-pay | Admitting: Occupational Therapy

## 2021-10-05 DIAGNOSIS — M25541 Pain in joints of right hand: Secondary | ICD-10-CM | POA: Diagnosis not present

## 2021-10-05 DIAGNOSIS — M6281 Muscle weakness (generalized): Secondary | ICD-10-CM

## 2021-10-05 DIAGNOSIS — R278 Other lack of coordination: Secondary | ICD-10-CM

## 2021-10-05 DIAGNOSIS — M79631 Pain in right forearm: Secondary | ICD-10-CM

## 2021-10-05 NOTE — Therapy (Signed)
OUTPATIENT OCCUPATIONAL THERAPY Treatment   Patient Name: Laura Daniel MRN: [id number] DOB:[birth year], 56 y.o., female Today's Date: 10/05/2021  PCP: [place], Betty G, MD REFERRING PROVIDER: Jannette Fogo, MD   OT End of Session - 10/05/21 1318     Visit Number 4    Number of Visits 17    Date for OT Re-Evaluation 11/19/21    Authorization Type [employer]    OT Start Time 0845    OT Stop Time 0933    OT Time Calculation (min) 48 min    Activity Tolerance Patient tolerated treatment well    Behavior During Therapy WFL for tasks assessed/performed             Past Medical History:  Diagnosis Date   Allergy    Chicken pox denies   Past Surgical History:  Procedure Laterality Date   ABDOMINAL HYSTERECTOMY     There are no problems to display for this patient.   ONSET DATE: 06/29/2021   REFERRING DIAG: M65.4 (ICD-10-CM) - Radial styloid tenosynovitis (de quervain)  THERAPY DIAG:  Pain in joint of right hand  Pain in right forearm  Muscle weakness (generalized)  Other lack of coordination  Rationale for Evaluation and Treatment Rehabilitation  SUBJECTIVE:   SUBJECTIVE STATEMENT: Pt repots pain after forgetting "to put wrist band on at night". Pt appears to be wearing elastic stockinette or compression sleeve at noc when she remembers to do so.  Pt accompanied by: self and daughter   PAIN:  Are you having pain? Yes 6/10 in right first dorsal compartment. Pt reports that her pain decreases when using her "wrist band at night", activity and not wearing something on her arm at noc makes her symptoms worse.  PATIENT GOALS to get rid of the pain  OBJECTIVE:   Today's Treatment:   Pt was fitted with a right forearm  custom fabricated thumb spica splint that places her dominant thumb in the functional position of abduction and slight rotation with a neutral wrist. Pt and her daughter were educated in splinting use, care and precautions. She will wear this splint at  all times, removing PRN to bath or wash her hands, or when resting/watching TV in the evenings. Pt was also educated in and performed Finklestein's type stretch (with her elbow flexed) to her first dorsal compartment  - using the stretch, then relax technique - she was instructed to relax and not perform radial deviation or extend her wrist between stretches at any time. She was able to perform in the clinic and verbalized understanding of all of the above. Pt was also instructed to only use neutral wrist/palm up technique to carry objects and avoid grip/pinch and radial deviation (such as carrying a plate or book) as well as lifting things that cause grip with deviation. She will also implement the RICE technique. She will bring her splint back to the next visit and adjustments can be made PRN at that time.    PATIENT EDUCATION: Education details: Educated on modifying stretch within pain tolerance and keeping movements even, slow, and smooth.  Mod demonstration and education on improved technique for putty exercises and thumb opposition. Person educated: Patient Education method: Explanation, Demonstration, and Handouts Education comprehension: verbalized understanding   HOME EXERCISE PROGRAM: [phone]  GOALS: Goals reviewed with patient? Yes  SHORT TERM GOALS: Target date:  10/22/2021     STG/ LTG  Status:  1 Pt will be Independent with HEP for strengthening and decrease pain in joints of finger/hands  to increase independence with ADLs and IADLs  Baseline:  On going  2 Pt will verbalize understanding of adapted strategies and AE as needed to maximize safety and Independence with ADLs/ IADLs . Baseline:  On going  3 Pt will be able to utilize RUE at diminished level during grooming tasks without increase in pain > 2/10 on pain scale. Baseline: Pt reports unable to utilize RUE during self-care tasks On going  4 Pt will be able to open containers as needed for ADLs and IADLs with use of RUE  at diminished level and/or with use of AE as needed. Baseline:  On going  5 Pt will demonstrate improved UE functional use for ADLs as evidenced by increasing box/ blocks score by 3 blocks with RUE. Baseline: On going   LONG TERM GOALS: Target date:  11/19/2021     STG/ LTG  Status:  1 Pt will verbalize and demonstrate functional improvements in RUE with score </= to 50 on Quick Dash. Baseline: 63.63 On going  2 Pt will independent with splint wear and care (PRN). Baseline: need for splint TBD On going  3 Pt will report improved functional use of dominant RUE during self-care tasks without increase in pain >2/10 (from starting pain report) on pain scale. Baseline:  On going  4 Pt will report improved ability to cut food without increase of pain > 2/10 on pain scale. Baseline:  On going  5 Pt will be able to retrieve items from refrigerator with use of RUE at dominant level without increase in pain >3/10 on pain scale. Baseline:  On going   ASSESSMENT:  CLINICAL IMPRESSION:  Session focused on custom fabrication of right thumb spica splint to allow for increased support and positioning to her first dorsal compartment. Pt's thumb is in the functional position of slight abduction and rotation or the functional position. Pt and her daughter were educated in splinting use, care and precautions and she will remove and bring to her next appointment if adjustments are needed. Additional focus was also on reviewing HEP and providing modifications to stretches and ensuring proper technique. Pt should experience less pain if she uses neutral wrist with palm up technique to carry objects and avoid grip/pinch and radial deviation (such as carrying a plate or book) as well as lifting things that cause grip with deviation. Pt should also benefit from Finklestein's type stretch/relax with elbow flexed initially. Pt was able to replicate in clinic today. Pt's daughter was present throughout the session to  assist with interpretation as pt does understand English, but is not a native speaker.    PERFORMANCE DEFICITS in functional skills including ADLs, IADLs, coordination, dexterity, ROM, strength, pain, FMC, GMC, and UE functional use.  IMPAIRMENTS are limiting patient from ADLs, IADLs, rest and sleep, and leisure.   COMORBIDITIES may have co-morbidities  that affects occupational performance. Patient will benefit from skilled OT to address above impairments and improve overall function.  MODIFICATION OR ASSISTANCE TO COMPLETE EVALUATION: No modification of tasks or assist necessary to complete an evaluation.  OT OCCUPATIONAL PROFILE AND HISTORY: Detailed assessment: Review of records and additional review of physical, cognitive, psychosocial history related to current functional performance.  CLINICAL DECISION MAKING: LOW - limited treatment options, no task modification necessary  REHAB POTENTIAL: Good  EVALUATION COMPLEXITY: Low     PLAN: OT FREQUENCY: 2x/week  OT DURATION: 8 weeks  PLANNED INTERVENTIONS: self care/ADL training, therapeutic exercise, therapeutic activity, neuromuscular re-education, manual therapy, splinting, electrical stimulation, ultrasound, iontophoresis,  fluidotherapy, compression bandaging, moist heat, cryotherapy, patient/family education, and DME and/or AE instructions  RECOMMENDED OTHER SERVICES: N/A  CONSULTED AND AGREED WITH PLAN OF CARE: Patient and family member/caregiver  PLAN FOR NEXT SESSION: Splint check and adjustments PRN, R UE. Review HEP (RICE technique) for tenosynovitis. First dorsal compartment (Finklestein's type stretch with elbow flexed initially) and no activity that involves grip and radial deviation at this time. Review palm up and neutral wrist technique with R wrist. Consider cryotherapy and pulsed [place].    Charletta Cousin, Babs Dabbs Beth Dixon, OTR/L 10/05/2021, 1:21 PM

## 2021-10-07 ENCOUNTER — Encounter: Payer: Self-pay | Admitting: Occupational Therapy

## 2021-10-07 ENCOUNTER — Ambulatory Visit: Payer: BC Managed Care – PPO | Admitting: Occupational Therapy

## 2021-10-07 DIAGNOSIS — M79631 Pain in right forearm: Secondary | ICD-10-CM

## 2021-10-07 DIAGNOSIS — M25541 Pain in joints of right hand: Secondary | ICD-10-CM

## 2021-10-07 DIAGNOSIS — M6281 Muscle weakness (generalized): Secondary | ICD-10-CM

## 2021-10-07 DIAGNOSIS — R278 Other lack of coordination: Secondary | ICD-10-CM

## 2021-10-12 ENCOUNTER — Ambulatory Visit: Payer: BC Managed Care – PPO | Admitting: Occupational Therapy

## 2021-10-12 DIAGNOSIS — M25541 Pain in joints of right hand: Secondary | ICD-10-CM

## 2021-10-12 DIAGNOSIS — R278 Other lack of coordination: Secondary | ICD-10-CM

## 2021-10-12 DIAGNOSIS — M6281 Muscle weakness (generalized): Secondary | ICD-10-CM

## 2021-10-14 ENCOUNTER — Ambulatory Visit: Payer: BC Managed Care – PPO | Admitting: Occupational Therapy

## 2021-10-14 DIAGNOSIS — M25541 Pain in joints of right hand: Secondary | ICD-10-CM | POA: Diagnosis not present

## 2021-10-14 DIAGNOSIS — R278 Other lack of coordination: Secondary | ICD-10-CM

## 2021-10-14 DIAGNOSIS — M6281 Muscle weakness (generalized): Secondary | ICD-10-CM

## 2021-10-14 NOTE — Therapy (Signed)
OUTPATIENT OCCUPATIONAL THERAPY Treatment   Patient Name: Laura Daniel MRN: [id number] DOB:1965/05/16, 56 y.o., female Today's Date: 10/14/2021  PCP: [place], Betty G, MD REFERRING PROVIDER: Jannette Fogo, MD   OT End of Session - 10/14/21 0935     Visit Number 7    Number of Visits 17    Date for OT Re-Evaluation 11/19/21    Authorization Type [employer]    OT Start Time [phone]    OT Stop Time 0932    OT Time Calculation (min) 40 min    Activity Tolerance Patient tolerated treatment well    Behavior During Therapy WFL for tasks assessed/performed                Past Medical History:  Diagnosis Date   Allergy    Chicken pox denies   Past Surgical History:  Procedure Laterality Date   ABDOMINAL HYSTERECTOMY     There are no problems to display for this patient.   ONSET DATE: 06/29/2021   REFERRING DIAG: M65.4 (ICD-10-CM) - Radial styloid tenosynovitis (de quervain)  THERAPY DIAG:  Muscle weakness (generalized)  Other lack of coordination  Pain in joint of right hand  Rationale for Evaluation and Treatment Rehabilitation  SUBJECTIVE:   SUBJECTIVE STATEMENT: Pt reports ability to clean self after toileting with no pain for the first time in ~6 months!  Pt accompanied by: self and daughter  PAIN:  Are you having pain? No, "I don't have pain today".  PATIENT GOALS to get rid of the pain  OBJECTIVE:   Today's Treatment: Educated on and engaged in [employer], providing pt and daughter with education during technique and providing with handout. Instructed pt in isometric stretch at thumb and Finklesteins stretch with focus on maintaining wrist straight while focusing on thumb mobility.  Reiterated use of tabletop to maintain positioning during thumb flexion.   Educated on possibility of use of ionto but plan to continue to assess with RICE and massage.   PATIENT EDUCATION: Education details: Educated on modifying stretch within pain tolerance and keeping  movements even, slow, and smooth.  Massage and RICE. Person educated: Patient Education method: Explanation, Demonstration, and Handouts Education comprehension: verbalized understanding   HOME EXERCISE PROGRAM: [phone]  GOALS: Goals reviewed with patient? Yes  SHORT TERM GOALS: Target date:  10/22/2021     STG/ LTG  Status:  1 Pt will be Independent with HEP for strengthening and decrease pain in joints of finger/hands to increase independence with ADLs and IADLs  Baseline:  On going  2 Pt will verbalize understanding of adapted strategies and AE as needed to maximize safety and Independence with ADLs/ IADLs . Baseline:  On going  3 Pt will be able to utilize RUE at diminished level during grooming tasks without increase in pain > 2/10 on pain scale. Baseline: Pt reports unable to utilize RUE during self-care tasks On going  4 Pt will be able to open containers as needed for ADLs and IADLs with use of RUE at diminished level and/or with use of AE as needed. Baseline:  On going  5 Pt will demonstrate improved UE functional use for ADLs as evidenced by increasing box/ blocks score by 3 blocks with RUE. Baseline: On going   LONG TERM GOALS: Target date:  11/19/2021     STG/ LTG  Status:  1 Pt will verbalize and demonstrate functional improvements in RUE with score </= to 50 on Quick Dash. Baseline: 63.63 On going  2 Pt will independent  with splint wear and care (PRN). Baseline: need for splint TBD On going  3 Pt will report improved functional use of dominant RUE during self-care tasks without increase in pain >2/10 (from starting pain report) on pain scale. Baseline:  On going  4 Pt will report improved ability to cut food without increase of pain > 2/10 on pain scale. Baseline:  On going  5 Pt will be able to retrieve items from refrigerator with use of RUE at dominant level without increase in pain >3/10 on pain scale. Baseline:  On going   ASSESSMENT:  CLINICAL  IMPRESSION: Pt  continues to demonstrate and report improvements in pain. Neoprene splint has not arrived as of this session, however should arrive today.  Discussed further treatment options to include iontophoresis if approved by MD, however plan to continue with RICE, ice massage, and isometric stretches within pain tolerance.  Discussed possibility of decreasing frequency based on current pain level and ability to utilize RUE during functional tasks.  Will reassess at next session.   PERFORMANCE DEFICITS in functional skills including ADLs, IADLs, coordination, dexterity, ROM, strength, pain, FMC, GMC, and UE functional use.  IMPAIRMENTS are limiting patient from ADLs, IADLs, rest and sleep, and leisure.   COMORBIDITIES may have co-morbidities  that affects occupational performance. Patient will benefit from skilled OT to address above impairments and improve overall function.  MODIFICATION OR ASSISTANCE TO COMPLETE EVALUATION: No modification of tasks or assist necessary to complete an evaluation.  OT OCCUPATIONAL PROFILE AND HISTORY: Detailed assessment: Review of records and additional review of physical, cognitive, psychosocial history related to current functional performance.  CLINICAL DECISION MAKING: LOW - limited treatment options, no task modification necessary  REHAB POTENTIAL: Good  EVALUATION COMPLEXITY: Low     PLAN: OT FREQUENCY: 2x/week  OT DURATION: 8 weeks  PLANNED INTERVENTIONS: self care/ADL training, therapeutic exercise, therapeutic activity, neuromuscular re-education, manual therapy, splinting, electrical stimulation, ultrasound, iontophoresis, fluidotherapy, compression bandaging, moist heat, cryotherapy, patient/family education, and DME and/or AE instructions  RECOMMENDED OTHER SERVICES: N/A  CONSULTED AND AGREED WITH PLAN OF CARE: Patient and family member/caregiver  PLAN FOR NEXT SESSION: Review Ice massage and HEP (RICE technique) for  tenosynovitis. First dorsal compartment (Finklestein's type stretch with elbow flexed initially) and no activity that involves grip, radial deviation or active thumb extension at this time. Review palm up and neutral wrist technique with R wrist. Cryotherapy and pulsed [place].    Rosalio Loud, OTR/L 10/14/2021, 9:36 AM

## 2021-10-20 ENCOUNTER — Ambulatory Visit: Payer: BC Managed Care – PPO | Attending: Orthopedic Surgery | Admitting: Occupational Therapy

## 2021-10-20 ENCOUNTER — Encounter: Payer: Self-pay | Admitting: Occupational Therapy

## 2021-10-20 DIAGNOSIS — M6281 Muscle weakness (generalized): Secondary | ICD-10-CM | POA: Diagnosis present

## 2021-10-20 DIAGNOSIS — R278 Other lack of coordination: Secondary | ICD-10-CM | POA: Insufficient documentation

## 2021-10-20 DIAGNOSIS — M25541 Pain in joints of right hand: Secondary | ICD-10-CM | POA: Diagnosis present

## 2021-10-20 DIAGNOSIS — M79631 Pain in right forearm: Secondary | ICD-10-CM | POA: Diagnosis present

## 2021-10-20 NOTE — Therapy (Unsigned)
OUTPATIENT OCCUPATIONAL THERAPY TREATMENT NOTE   Patient Name: Laura Daniel MRN: [id number] DOB:[birth year], 56 y.o., female Today's Date: 10/20/2021  PCP: [place], Betty G, MD REFERRING PROVIDER: Vassie Loll, MD   OT End of Session - 10/20/21 [phone]     Visit Number 8    Number of Visits 17    Date for OT Re-Evaluation 11/19/21    Authorization Type BC[employer]    OT Start Time 0935    OT Stop Time 1015    OT Time Calculation (min) 40 min    Activity Tolerance Patient tolerated treatment well    Behavior During Therapy WFL for tasks assessed/performed            Past Medical History:  Diagnosis Date   Allergy    Chicken pox denies   Past Surgical History:  Procedure Laterality Date   ABDOMINAL HYSTERECTOMY     There are no problems to display for this patient.   ONSET DATE: 06/29/2021   REFERRING DIAG: M65.4 (ICD-10-CM) - Radial styloid tenosynovitis (de quervain)  THERAPY DIAG:  Muscle weakness (generalized)  Other lack of coordination  Pain in joint of right hand  Pain in right forearm  Rationale for Evaluation and Treatment Rehabilitation  SUBJECTIVE:   SUBJECTIVE STATEMENT: Pt reports the pain is feeling "better and better"  Pt accompanied by: self and daughter  PAIN: Are you having pain? No  PATIENT GOALS: to get rid of the pain   OBJECTIVE:   TODAY'S TREATMENT - 10/20/21: OT discussed wear and purpose of self-purchased neoprene brace vs custom-fabricated orthosis made in prior session; discussed benefit of immobilization for symptom management and improvement Ultrasound treatment applied to first dorsal compartment and radial/anterior region of wrist and forearm for 8 min to address edema, relieve pain, and promote blood flow for tissue healing. Denied contraindications prior to tx. No adverse reaction observed or reported after application; skin intact. Parameters: 3.3 MHz, 20% duty cycle, and intensity of 0.8 W/cm2 R wrist flexion and wrist  extension w/ 1 lb dumbbell completed 2x15 each w/out pain   PATIENT EDUCATION: Ongoing condition-specific education, reviewing movements to avoid (e.g., repetitive pinching/gripping, especially w/ deviation and wrist flexion w/ thumb abducted), pain management techniques discussed in prior sessions, and massage Person educated: Patient Education method: Explanation, Demonstration, and Handouts Education comprehension: verbalized understanding   HOME EXERCISE PROGRAM: [id number]  GOALS: Goals reviewed with patient? Yes  SHORT TERM GOALS: Target date:  10/22/2021     STG  Status:  1 Pt will be Independent with HEP for strengthening and decrease pain in joints of finger/hands to increase independence with ADLs and IADLs  Baseline: Progressing  2 Pt will verbalize understanding of adapted strategies and AE as needed to maximize safety and Independence with ADLs/ IADLs. Baseline: Progressing  3 Pt will be able to utilize RUE at diminished level during grooming tasks without increase in pain > 2/10 on pain scale. Baseline: Pt reports unable to utilize RUE during self-care tasks Met - 10/20/21: per pt report  4 Pt will be able to open containers as needed for ADLs and IADLs with use of RUE at diminished level and/or with use of AE as needed. Baseline: Progressing  5 Pt will demonstrate improved UE functional use for ADLs as evidenced by increasing box/ blocks score by 3 blocks with RUE. Baseline: Progressing   LONG TERM GOALS: Target date:  11/19/2021     LTG  Status:  1 Pt will verbalize and demonstrate functional improvements in RUE  with score </= to 50 on Quick Dash Baseline: 63.63 Progressing  2 Pt will independent with splint wear and care (PRN). Baseline: need for splint TBD Progressing  3 Pt will report improved functional use of dominant RUE during self-care tasks without increase in pain >2/10 (from starting pain report) on pain scale. Baseline: Progressing  4 Pt will report  improved ability to cut food without increase of pain > 2/10 on pain scale. Baseline: Progressing  5 Pt will be able to retrieve items from refrigerator with use of RUE at dominant level without increase in pain >3/10 on pain scale. Baseline: Progressing   ASSESSMENT:  CLINICAL IMPRESSION: Pt continues to report improvement in pain and brought in self-purchased neoprene brace to session today. OT encouraged full immobilization per protocol, but due to discomfort reported by pt, confirmed neoprene brace w/ excellent compliance avoiding precautions and aggravating movement will hopefully be sufficient. OT also reviewed protocol and discussed that strengthening will be deferred until pain has resolved; pt verbalized understanding. Considering pt's progress, OT encouraged decreasing frequency to 1x/week to allow continued incorporation of beneficial therapeutic modalities and guided progression of protocol w/ emphasis on independent carryover of exercises and learned strategies to home. Pt is agreeable to plan at this time.  PERFORMANCE DEFICITS in functional skills including ADLs, IADLs, coordination, dexterity, ROM, strength, pain, [place], GMC, and UE functional use.  IMPAIRMENTS are limiting patient from ADLs, IADLs, rest and sleep, and leisure.   COMORBIDITIES may have co-morbidities  that affects occupational performance. Patient will benefit from skilled OT to address above impairments and improve overall function.   PLAN: OT FREQUENCY: 2x/week  OT DURATION: 8 weeks  PLANNED INTERVENTIONS: self care/ADL training, therapeutic exercise, therapeutic activity, neuromuscular re-education, manual therapy, splinting, electrical stimulation, ultrasound, iontophoresis, fluidotherapy, compression bandaging, moist heat, cryotherapy, patient/family education, and DME and/or AE instructions  RECOMMENDED OTHER SERVICES: N/A  CONSULTED AND AGREED WITH PLAN OF CARE: Patient and family  member/caregiver  PLAN FOR NEXT SESSION: Complete passive stretches per protocol; then attempt active stretches as able and w/out pain. Continue w/ pulsed ultrasound and/or soft tissue massage. No activity that involves grip, wrist deviation or active thumb extension at this time. Review palm up and neutral wrist technique with R wrist.    Kathrine Cords, OTR/L 10/20/2021, 2:00 PM

## 2021-10-22 ENCOUNTER — Encounter: Payer: BC Managed Care – PPO | Admitting: Occupational Therapy

## 2021-10-25 ENCOUNTER — Ambulatory Visit: Payer: BC Managed Care – PPO | Admitting: Occupational Therapy

## 2021-10-25 DIAGNOSIS — M25541 Pain in joints of right hand: Secondary | ICD-10-CM

## 2021-10-25 DIAGNOSIS — M6281 Muscle weakness (generalized): Secondary | ICD-10-CM | POA: Diagnosis not present

## 2021-10-25 DIAGNOSIS — M79631 Pain in right forearm: Secondary | ICD-10-CM

## 2021-10-25 DIAGNOSIS — R278 Other lack of coordination: Secondary | ICD-10-CM

## 2021-10-25 NOTE — Therapy (Signed)
OUTPATIENT OCCUPATIONAL THERAPY TREATMENT NOTE   Patient Name: Laura Daniel MRN: [id number] DOB:31-Oct-1965, 56 y.o., female Today's Date: 10/25/2021  PCP: [place], Betty G, MD REFERRING PROVIDER: Daws, Janetta Hora, MD    Past Medical History:  Diagnosis Date   Allergy    Chicken pox denies   Past Surgical History:  Procedure Laterality Date   ABDOMINAL HYSTERECTOMY     There are no problems to display for this patient.   ONSET DATE: 06/29/2021   REFERRING DIAG: M65.4 (ICD-10-CM) - Radial styloid tenosynovitis (de quervain)  THERAPY DIAG:  No diagnosis found.  Rationale for Evaluation and Treatment Rehabilitation  SUBJECTIVE:   SUBJECTIVE STATEMENT: Pt reports pain with wrist flexion/extension exercise at home.  Pt accompanied by: self and daughter  PAIN: Are you having pain? No  PATIENT GOALS: to get rid of the pain   OBJECTIVE:   TODAY'S TREATMENT - 10/25/21: Reiterated and engaged in [employer], providing pt and daughter with education to reiterate use to manage swelling and relieve pain. Instructed pt in isometric stretch at thumb and [place] stretch with focus on maintaining wrist straight while focusing on thumb mobility.  Reiterated use of tabletop to maintain positioning during thumb flexion.   OT discussed wrist flexion/extension from previous session with pt stating that she did not have a 1# weight so used a water bottle for same weight.  Pt reports increased pain and prolonged pain when use of water bottle for weight.  Discussed thumb placement with use of water bottle vs dumbbell - encouraged purchase of dumbbell for improved hand placement.  Encouraged pt to remove weight exercise at this time and resume isometric stretch and [place] stretch.     PATIENT EDUCATION: Ongoing condition-specific education, reviewing movements to avoid (e.g., repetitive pinching/gripping, especially w/ deviation and wrist flexion w/ thumb abducted), pain  management techniques discussed in prior sessions, and massage Person educated: Patient Education method: Explanation, Demonstration, and Handouts Education comprehension: verbalized understanding   HOME EXERCISE PROGRAM: [id number]  GOALS: Goals reviewed with patient? Yes  SHORT TERM GOALS: Target date:  10/22/2021     STG  Status:  1 Pt will be Independent with HEP for strengthening and decrease pain in joints of finger/hands to increase independence with ADLs and IADLs  Baseline: Progressing  2 Pt will verbalize understanding of adapted strategies and AE as needed to maximize safety and Independence with ADLs/ IADLs. Baseline: Met  3 Pt will be able to utilize RUE at diminished level during grooming tasks without increase in pain > 2/10 on pain scale. Baseline: Pt reports unable to utilize RUE during self-care tasks Met - 10/20/21: per pt report  4 Pt will be able to open containers as needed for ADLs and IADLs with use of RUE at diminished level and/or with use of AE as needed. Baseline: Progressing  5 Pt will demonstrate improved UE functional use for ADLs as evidenced by increasing box/ blocks score by 3 blocks with RUE. Baseline: Progressing   LONG TERM GOALS: Target date:  11/19/2021     LTG  Status:  1 Pt will verbalize and demonstrate functional improvements in RUE with score </= to 50 on Quick Dash Baseline: 63.63 Progressing  2 Pt will independent with splint wear and care (PRN). Baseline: need for splint TBD Progressing  3 Pt will report improved functional use of dominant RUE during self-care tasks without increase in pain >2/10 (from starting pain report) on pain scale. Baseline: Progressing  4 Pt will report improved ability to  cut food without increase of pain > 2/10 on pain scale. Baseline: Progressing  5 Pt will be able to retrieve items from refrigerator with use of RUE at dominant level without increase in pain >3/10 on pain scale. Baseline: Progressing    ASSESSMENT:  CLINICAL IMPRESSION: Pt continues to report improvement in pain during functional tasks, however reports pain with wrist flexion/extension exercises at home.  Pt utilizing bottle of water as she does not have dumbbells, however this grasp position on water bottle was placing greater strain on her thumb - therefore recommended stopping wrist flexion/extension at this time to allow rest and then resume when receiving appropriate weight. Pt wearing neoprene brace for immobilization and comfort as fabricated splint was causing too much pain.    PERFORMANCE DEFICITS in functional skills including ADLs, IADLs, coordination, dexterity, ROM, strength, pain, [place], GMC, and UE functional use.  IMPAIRMENTS are limiting patient from ADLs, IADLs, rest and sleep, and leisure.   COMORBIDITIES may have co-morbidities  that affects occupational performance. Patient will benefit from skilled OT to address above impairments and improve overall function.   PLAN: OT FREQUENCY: 2x/week  OT DURATION: 8 weeks  PLANNED INTERVENTIONS: self care/ADL training, therapeutic exercise, therapeutic activity, neuromuscular re-education, manual therapy, splinting, electrical stimulation, ultrasound, iontophoresis, fluidotherapy, compression bandaging, moist heat, cryotherapy, patient/family education, and DME and/or AE instructions  RECOMMENDED OTHER SERVICES: N/A  CONSULTED AND AGREED WITH PLAN OF CARE: Patient and family member/caregiver  PLAN FOR NEXT SESSION: Complete passive stretches per protocol; then attempt active stretches as able and w/out pain. Continue w/ pulsed ultrasound and/or soft tissue massage. No activity that involves grip, wrist deviation or active thumb extension at this time. Review palm up and neutral wrist technique with R wrist.    Tabby Beaston, OTR/L 10/25/2021, 9:30 AM

## 2021-10-26 ENCOUNTER — Encounter: Payer: BC Managed Care – PPO | Admitting: Occupational Therapy

## 2021-10-28 ENCOUNTER — Encounter: Payer: BC Managed Care – PPO | Admitting: Occupational Therapy

## 2021-11-02 ENCOUNTER — Ambulatory Visit: Payer: BC Managed Care – PPO | Admitting: Occupational Therapy

## 2021-11-02 DIAGNOSIS — M6281 Muscle weakness (generalized): Secondary | ICD-10-CM

## 2021-11-02 DIAGNOSIS — M79631 Pain in right forearm: Secondary | ICD-10-CM

## 2021-11-02 DIAGNOSIS — R278 Other lack of coordination: Secondary | ICD-10-CM

## 2021-11-02 DIAGNOSIS — M25541 Pain in joints of right hand: Secondary | ICD-10-CM

## 2021-11-02 NOTE — Therapy (Signed)
OUTPATIENT OCCUPATIONAL THERAPY TREATMENT NOTE   Patient Name: Laura Daniel MRN: [id number] DOB:08/08/1965, 56 y.o., female Today's Date: 11/02/2021  PCP: [place], Betty G, MD REFERRING PROVIDER: Vassie Loll, MD   OT End of Session - 11/02/21 0853     Visit Number 10    Number of Visits 17    Date for OT Re-Evaluation 11/19/21    Authorization Type [employer]    OT Start Time 0850    OT Stop Time 0930    OT Time Calculation (min) 40 min    Activity Tolerance Patient tolerated treatment well    Behavior During Therapy WFL for tasks assessed/performed             Past Medical History:  Diagnosis Date   Allergy    Chicken pox denies   Past Surgical History:  Procedure Laterality Date   ABDOMINAL HYSTERECTOMY     There are no problems to display for this patient.   ONSET DATE: 06/29/2021   REFERRING DIAG: M65.4 (ICD-10-CM) - Radial styloid tenosynovitis (de quervain)  THERAPY DIAG:  Muscle weakness (generalized)  Other lack of coordination  Pain in joint of right hand  Pain in right forearm  Rationale for Evaluation and Treatment Rehabilitation  SUBJECTIVE:   SUBJECTIVE STATEMENT: Pt reports "better, better, better; every day it's better."  Pt accompanied by: self and daughter  PAIN: Are you having pain? No  PATIENT GOALS: to get rid of the pain   OBJECTIVE:   TODAY'S TREATMENT - 11/02/21: Engaged in discussion about onset of pain when bumping hand or when extending thumb.  Pt did report engaging in some gardening tasks with use of RUE as gross to diminished assist, but notices that she uses her LUE when lifting anything or do anything strenuous.   Engaged in R wrist flexion and wrist extension with 1# dumbbell.  Completed 2 sets of 15 without any pain.  Pt reports mild discomfort with first few but subsided with repetition.  Therapist reiterated proper positioning and technique.  Reviewed applying heat prior to exercises at home and what to do if pain  returns with exercise. Engaged in R wrist active ulnar deviation, passive adduction of thumb with extension of the wrist, passive flexion of CMC and MP joints with ulnar deviation of the wrist.  Pt reports onset of "little" pain with MP flexion, therefore encouraged pt to ease off flexion in MP joint. Ultrasound treatment applied to first dorsal compartment and radial/anterior region of wrist and forearm for 8 min to address edema, relieve pain, and promote blood flow for tissue healing. Denied contraindications prior to tx. No adverse reaction observed or reported after application; skin intact. Parameters: 3.3 MHz, 20% duty cycle, and intensity of 0.8 W/cm2    PATIENT EDUCATION: Ongoing condition-specific education, reviewing movements to avoid (e.g., repetitive pinching/gripping, especially w/ deviation and wrist flexion w/ thumb abducted), pain management techniques discussed in prior sessions, and [place] massage Person educated: Patient Education method: Explanation, Demonstration, and Handouts Education comprehension: verbalized understanding   HOME EXERCISE PROGRAM: [id number]  GOALS: Goals reviewed with patient? Yes  SHORT TERM GOALS: Target date:  10/22/2021     STG  Status:  1 Pt will be Independent with HEP for strengthening and decrease pain in joints of finger/hands to increase independence with ADLs and IADLs  Baseline: Progressing  2 Pt will verbalize understanding of adapted strategies and AE as needed to maximize safety and Independence with ADLs/ IADLs. Baseline: Met  3 Pt will be able  to utilize RUE at diminished level during grooming tasks without increase in pain > 2/10 on pain scale. Baseline: Pt reports unable to utilize RUE during self-care tasks Met - 10/20/21: per pt report  4 Pt will be able to open containers as needed for ADLs and IADLs with use of RUE at diminished level and/or with use of AE as needed. Baseline: Progressing  5 Pt will demonstrate improved UE  functional use for ADLs as evidenced by increasing box/ blocks score by 3 blocks with RUE. Baseline: Progressing   LONG TERM GOALS: Target date:  11/19/2021     LTG  Status:  1 Pt will verbalize and demonstrate functional improvements in RUE with score </= to 50 on Quick Dash Baseline: 63.63 Progressing  2 Pt will independent with splint wear and care (PRN). Baseline: need for splint TBD Progressing  3 Pt will report improved functional use of dominant RUE during self-care tasks without increase in pain >2/10 (from starting pain report) on pain scale. Baseline: Progressing  4 Pt will report improved ability to cut food without increase of pain > 2/10 on pain scale. Baseline: Progressing  5 Pt will be able to retrieve items from refrigerator with use of RUE at dominant level without increase in pain >3/10 on pain scale. Baseline: Progressing   ASSESSMENT:  CLINICAL IMPRESSION: Pt continues to report improvement in pain during functional tasks, even engaging in increase IADL tasks.  Pt reports decreased wear of neoprene brace as she is reporting decreased pain in RUE.  Educated on splint wear, especially with increased activity and tasks that place strain on thumb.  Pt able to tolerate passive and active ROM at wrist this session and incorporated some CMC and MP joint flexion.  Pt with mild increase in discomfort initially with addition of weight, however reports pain subsiding with repetition. Pt reporting understanding of recommendations for exercises and when to decrease if onset of pain.  PERFORMANCE DEFICITS in functional skills including ADLs, IADLs, coordination, dexterity, ROM, strength, pain, [place], GMC, and UE functional use.  IMPAIRMENTS are limiting patient from ADLs, IADLs, rest and sleep, and leisure.   COMORBIDITIES may have co-morbidities  that affects occupational performance. Patient will benefit from skilled OT to address above impairments and improve overall  function.   PLAN: OT FREQUENCY: 2x/week  OT DURATION: 8 weeks  PLANNED INTERVENTIONS: self care/ADL training, therapeutic exercise, therapeutic activity, neuromuscular re-education, manual therapy, splinting, electrical stimulation, ultrasound, iontophoresis, fluidotherapy, compression bandaging, moist heat, cryotherapy, patient/family education, and DME and/or AE instructions  RECOMMENDED OTHER SERVICES: N/A  CONSULTED AND AGREED WITH PLAN OF CARE: Patient and family member/caregiver  PLAN FOR NEXT SESSION: Complete passive stretches per protocol; then attempt active stretches as able and w/out pain. Continue w/ pulsed ultrasound and/or soft tissue massage. No activity that involves grip, wrist deviation or active thumb extension at this time. Review palm up and neutral wrist technique with R wrist.    Rayen Palen, OTR/L 11/02/2021, 8:54 AM

## 2021-11-04 ENCOUNTER — Encounter: Payer: BC Managed Care – PPO | Admitting: Occupational Therapy

## 2021-11-09 ENCOUNTER — Ambulatory Visit: Payer: BC Managed Care – PPO | Admitting: Occupational Therapy

## 2021-11-09 DIAGNOSIS — R278 Other lack of coordination: Secondary | ICD-10-CM

## 2021-11-09 DIAGNOSIS — M79631 Pain in right forearm: Secondary | ICD-10-CM

## 2021-11-09 DIAGNOSIS — M6281 Muscle weakness (generalized): Secondary | ICD-10-CM | POA: Diagnosis not present

## 2021-11-09 DIAGNOSIS — M25541 Pain in joints of right hand: Secondary | ICD-10-CM

## 2021-11-09 NOTE — Therapy (Signed)
OUTPATIENT OCCUPATIONAL THERAPY TREATMENT NOTE   Patient Name: Laura Daniel MRN: [id number] DOB:1965/05/19, 56 y.o., female Today's Date: 11/09/2021  PCP: Martinique, Betty G, MD REFERRING PROVIDER: Vassie Loll, MD   OT End of Session - 11/09/21 0855     Visit Number 11    Number of Visits 17    Date for OT Re-Evaluation 11/19/21    Authorization Type BCBS    OT Start Time [phone]    OT Stop Time 0932    OT Time Calculation (min) 39 min    Activity Tolerance Patient tolerated treatment well    Behavior During Therapy [facility] for tasks assessed/performed              Past Medical History:  Diagnosis Date   Allergy    Chicken pox denies   Past Surgical History:  Procedure Laterality Date   ABDOMINAL HYSTERECTOMY     There are no problems to display for this patient.   ONSET DATE: 06/29/2021   REFERRING DIAG: M65.4 (ICD-10-CM) - Radial styloid tenosynovitis (de quervain)  THERAPY DIAG:  Muscle weakness (generalized)  Other lack of coordination  Pain in joint of right hand  Pain in right forearm  Rationale for Evaluation and Treatment Rehabilitation  SUBJECTIVE:   SUBJECTIVE STATEMENT: Pt reports first 2 days of exercises with weight that she did not have any pain, but the next time she did.  She took a day off and then the next time she attempted, she had pain again - so she did not do any further exercises with weight.  Pt reports having increased pain on Sunday, wondering if she did something to overdo it.  Pt accompanied by: self and daughter  PAIN: Are you having pain? No  PATIENT GOALS: to get rid of the pain   OBJECTIVE:   TODAY'S TREATMENT - 11/09/21: Reviewed onset of pain and instances of pain.  Pt reports no pain during exercises, but onset of pain afterwards. Third day, pt reports increased pain and even pain over night.  Pt reports that she is utilizing her RUE more during functional tasks with no reports of pain.  Pain only after exercise with  weight. Therapist applied heat to R hand and wrist for 10 mins prior to engaging in PROM and AROM.  Pt with no adverse reactions to heat. Therapist reiterated and directed pt in exercises with palm supported on table top.  Pt completing ulnar deviation of the wrist on tabletop progressing to over side of table.  Pt reports no increase in pain.  Therapist instructed pt in passive adduction of the thumb combined with passive extension of the wrist.  Pt reports increased pain with PROM, but able to tolerate AROM of thumb adduction.  Pt encouraged to complete minimal thumb adduction when combining with wrist extension.  Engaged in Passive and active flexion of CMC and MP joints with no increase in pain. Therapist reiterated importance of no active wrist and digit movements during functional tasks or thumb abduction.  Discussed modifications of hand placement and grasp when holding items in R hand or completing functional tasks, such as ironing, washing dishes, stirring foods when cooking.   PATIENT EDUCATION: Ongoing condition-specific education, reviewing movements to avoid (e.g., repetitive pinching/gripping, especially w/ deviation and wrist flexion w/ thumb abducted), pain management techniques discussed in prior sessions, and Ice massage Person educated: Patient Education method: Explanation, Demonstration, and Handouts Education comprehension: verbalized understanding   HOME EXERCISE PROGRAM: [id number]  GOALS: Goals reviewed with patient? Yes  SHORT TERM GOALS: Target date:  10/22/2021     STG  Status:  1 Pt will be Independent with HEP for strengthening and decrease pain in joints of finger/hands to increase independence with ADLs and IADLs  Baseline: Progressing  2 Pt will verbalize understanding of adapted strategies and AE as needed to maximize safety and Independence with ADLs/ IADLs. Baseline: Met  3 Pt will be able to utilize RUE at diminished level during grooming tasks without  increase in pain > 2/10 on pain scale. Baseline: Pt reports unable to utilize RUE during self-care tasks Met - 10/20/21: per pt report  4 Pt will be able to open containers as needed for ADLs and IADLs with use of RUE at diminished level and/or with use of AE as needed. Baseline: Progressing  5 Pt will demonstrate improved UE functional use for ADLs as evidenced by increasing box/ blocks score by 3 blocks with RUE. Baseline: Progressing   LONG TERM GOALS: Target date:  11/19/2021     LTG  Status:  1 Pt will verbalize and demonstrate functional improvements in RUE with score </= to 50 on Quick Dash Baseline: 63.63 Progressing  2 Pt will independent with splint wear and care (PRN). Baseline: need for splint TBD Progressing  3 Pt will report improved functional use of dominant RUE during self-care tasks without increase in pain >2/10 (from starting pain report) on pain scale. Baseline: Progressing  4 Pt will report improved ability to cut food without increase of pain > 2/10 on pain scale. Baseline: Progressing  5 Pt will be able to retrieve items from refrigerator with use of RUE at dominant level without increase in pain >3/10 on pain scale. Baseline: Progressing   ASSESSMENT:  CLINICAL IMPRESSION: Pt continues to report improvement in pain during functional tasks, even engaging in increase IADL tasks.  Pt reports decreased wear of neoprene brace as she is reporting decreased pain in RUE.  Educated on splint wear, especially with increased activity and tasks that place strain on thumb.  Pt able to tolerate passive and active ROM at wrist this session and incorporated some CMC and MP joint flexion.  Pt reports onset of pain with use of 1# weight with exercises at home, therefore recommended decreasing weight and completing only PROM and AROM exercises, but to continue to increase active engagement of RUE into ADLs and IADLs. Pt reporting understanding of recommendations for exercises and  IADLs  PERFORMANCE DEFICITS in functional skills including ADLs, IADLs, coordination, dexterity, ROM, strength, pain, [place], GMC, and UE functional use.  IMPAIRMENTS are limiting patient from ADLs, IADLs, rest and sleep, and leisure.   COMORBIDITIES may have co-morbidities  that affects occupational performance. Patient will benefit from skilled OT to address above impairments and improve overall function.   PLAN: OT FREQUENCY: 2x/week  OT DURATION: 8 weeks  PLANNED INTERVENTIONS: self care/ADL training, therapeutic exercise, therapeutic activity, neuromuscular re-education, manual therapy, splinting, electrical stimulation, ultrasound, iontophoresis, fluidotherapy, compression bandaging, moist heat, cryotherapy, patient/family education, and DME and/or AE instructions  RECOMMENDED OTHER SERVICES: N/A  CONSULTED AND AGREED WITH PLAN OF CARE: Patient and family member/caregiver  PLAN FOR NEXT SESSION: Complete passive stretches per protocol; then attempt active stretches as able and w/out pain. Continue w/ pulsed ultrasound and/or soft tissue massage. Review palm up and neutral wrist technique with R wrist. Assess pain with functional tasks, review goals, and discuss possibility of taking a break from therapy vs d/c.    Simonne Come, OTR/L 11/09/2021, 8:56 AM

## 2021-11-11 ENCOUNTER — Encounter: Payer: BC Managed Care – PPO | Admitting: Occupational Therapy

## 2021-11-16 ENCOUNTER — Ambulatory Visit: Payer: BC Managed Care – PPO | Attending: Orthopedic Surgery | Admitting: Occupational Therapy

## 2021-11-16 ENCOUNTER — Encounter: Payer: BC Managed Care – PPO | Admitting: Occupational Therapy

## 2021-11-16 DIAGNOSIS — M25541 Pain in joints of right hand: Secondary | ICD-10-CM | POA: Insufficient documentation

## 2021-11-16 DIAGNOSIS — M79631 Pain in right forearm: Secondary | ICD-10-CM | POA: Insufficient documentation

## 2021-11-16 DIAGNOSIS — R278 Other lack of coordination: Secondary | ICD-10-CM | POA: Diagnosis present

## 2021-11-16 DIAGNOSIS — M6281 Muscle weakness (generalized): Secondary | ICD-10-CM | POA: Insufficient documentation

## 2021-11-16 NOTE — Therapy (Signed)
OUTPATIENT OCCUPATIONAL THERAPY TREATMENT NOTE   Patient Name: Laura Daniel MRN: [id number] DOB:[birth year], 56 y.o., female Today's Date: 11/16/2021  PCP: [place], Betty G, MD REFERRING PROVIDER: Vassie Loll, MD   OT End of Session - 11/16/21 1533     Visit Number 12    Number of Visits 17    Date for OT Re-Evaluation 11/19/21    Authorization Type BCBS    OT Start Time 3903    OT Stop Time 1606    OT Time Calculation (min) 34 min    Activity Tolerance Patient tolerated treatment well    Behavior During Therapy WFL for tasks assessed/performed               Past Medical History:  Diagnosis Date   Allergy    Chicken pox denies   Past Surgical History:  Procedure Laterality Date   ABDOMINAL HYSTERECTOMY     There are no problems to display for this patient.   ONSET DATE: 06/29/2021   REFERRING DIAG: M65.4 (ICD-10-CM) - Radial styloid tenosynovitis (de quervain)  THERAPY DIAG:  Muscle weakness (generalized)  Other lack of coordination  Pain in joint of right hand  Pain in right forearm  Rationale for Evaluation and Treatment Rehabilitation  SUBJECTIVE:   SUBJECTIVE STATEMENT: Pt reports "every day is good, good, better"  Pt accompanied by: self and daughter  PAIN: Are you having pain? No  PATIENT GOALS: to get rid of the pain   OBJECTIVE:   FUNCTIONAL OUTCOME MEASURES: Quick Dash: 63.63 11/16/21: 40.9      COORDINATION: 9 Hole Peg test: Right: 26.94 sec; Left: 23.41 sec Box and Blocks:  Right 51 blocks, Left 56 blocks Reports pain 1/10 with activity 11/16/21: Box and blocks Right 60 blocks with no increase in pain    TODAY'S TREATMENT - 11/16/21: Completed QuickDASH with results of 40.9, improving from 63.63 on evaluation.  Pt reports moderate difficulty with majority of tasks that require force, but has developed adaptive strategies and techniques to complete those tasks to minimize pain. Reviewed all goals with pt reporting no pain  with basic ADLs, toileting hygiene, and grooming tasks, but some pain < 2/10 increase with tasks requiring mild force.  Pt reports ability to retreive light weight items from refrigerator and open door with R hand without increased pain.  Pt reports continued pain > 2/10 with cutting of food and any tasks that require force or impact through arm. Reviewed sleeping positions as pt reports overnight brace is now causing increased pain at nighttime.  Therapist suggested trial of sleeping without brace but being aware of positioning if not wearing brace at night time.     PATIENT EDUCATION: Ongoing condition-specific education, reviewing movements to avoid (e.g., repetitive pinching/gripping, especially w/ deviation and wrist flexion w/ thumb abducted), pain management techniques discussed in prior sessions, and Ice massage Person educated: Patient Education method: Explanation, Demonstration, and Handouts Education comprehension: verbalized understanding   HOME EXERCISE PROGRAM: [id number]  GOALS: Goals reviewed with patient? Yes  SHORT TERM GOALS: Target date:  10/22/2021     STG  Status:  1 Pt will be Independent with HEP for strengthening and decrease pain in joints of finger/hands to increase independence with ADLs and IADLs  Baseline: Met  2 Pt will verbalize understanding of adapted strategies and AE as needed to maximize safety and Independence with ADLs/ IADLs. Baseline: Met  3 Pt will be able to utilize RUE at diminished level during grooming tasks without increase in pain >  2/10 on pain scale. Baseline: Pt reports unable to utilize RUE during self-care tasks Met - 10/20/21: per pt report  4 Pt will be able to open containers as needed for ADLs and IADLs with use of RUE at diminished level and/or with use of AE as needed. Baseline: Met - reports stabilizing items in crook of R arm and opening with LUE 11/16/21  5 Pt will demonstrate improved UE functional use for ADLs as evidenced by  increasing box/ blocks score by 3 blocks with RUE. Baseline: Met - 60 with LUE on 11/16/21   LONG TERM GOALS: Target date:  11/19/2021     LTG  Status:  1 Pt will verbalize and demonstrate functional improvements in RUE with score </= to 50 on Quick Dash Baseline: 63.63 Met - 40.9 on 11/16/21  2 Pt will independent with splint wear and care (PRN). Baseline: need for splint TBD Met  3 Pt will report improved functional use of dominant RUE during self-care tasks without increase in pain >2/10 (from starting pain report) on pain scale. Baseline: Met - no reports of pain with ADLs, only pain with force during IADLs  4 Pt will report improved ability to cut food without increase of pain > 2/10 on pain scale. Baseline: Progressing  5 Pt will be able to retrieve items from refrigerator with use of RUE at dominant level without increase in pain >3/10 on pain scale. Baseline: Met - reports increased pain with heavy or large items   ASSESSMENT:  CLINICAL IMPRESSION: Treatment session with focus on taking object measures and reviewing goals.  Pt reports significant improvement with ability to complete ADLs without any pain and min increase in pain with IADLs, especially tasks that require increased hand opening or force.  Therapist reiterated adaptive techniques and AE to decrease strain and resulting pain on RUE.  Pt pleased with progress and continues to engage in ice massage, moist heat, and passive and active ROM at wrist and CMC and MP joint flexion. Reviewed typical recovery time frame and current progress and quality of life with significantly reduced pain. At this time, skilled occupational therapy services are no longer indicated. Pt requests to defer?for ~6 weeks to determine continued necessity of skilled OT services. Pt encouraged to call back before that time with any changes or if any relevant functional deficits develop/occur. Pt to be discharged from OP OT if no further therapy is warranted by  12/31/21.  Pt and daughter in agreement with plan  PERFORMANCE DEFICITS in functional skills including ADLs, IADLs, coordination, dexterity, ROM, strength, pain, [place], GMC, and UE functional use.  IMPAIRMENTS are limiting patient from ADLs, IADLs, rest and sleep, and leisure.   COMORBIDITIES may have co-morbidities  that affects occupational performance. Patient will benefit from skilled OT to address above impairments and improve overall function.   PLAN: OT FREQUENCY: 2x/week  OT DURATION: 8 weeks  PLANNED INTERVENTIONS: self care/ADL training, therapeutic exercise, therapeutic activity, neuromuscular re-education, manual therapy, splinting, electrical stimulation, ultrasound, iontophoresis, fluidotherapy, compression bandaging, moist heat, cryotherapy, patient/family education, and DME and/or AE instructions  RECOMMENDED OTHER SERVICES: N/A  CONSULTED AND AGREED WITH PLAN OF CARE: Patient and family member/caregiver  PLAN FOR NEXT SESSION: Hold for ~6 weeks with pt to call back before that time with any changes or if any relevant functional deficits develop/occur. Pt to be discharged from OP OT if no further therapy is warranted by 12/31/21.    Simonne Come, OTR/L 11/16/2021, 4:23 PM

## 2021-11-18 ENCOUNTER — Encounter: Payer: BC Managed Care – PPO | Admitting: Occupational Therapy

## 2022-01-12 ENCOUNTER — Encounter: Payer: Self-pay | Admitting: Occupational Therapy

## 2022-01-12 NOTE — Therapy (Signed)
[facility] [address], [place], [place], [address] Phone: [phone]   Fax:  [phone]  Patient Details  Name: Laura Daniel MRN: [id number] Date of Birth: 02-21-1966 Referring Provider:  No ref. provider found  Encounter Date: 01/12/2022  OCCUPATIONAL THERAPY DISCHARGE SUMMARY  Visits from Start of Care: 12  Current functional level related to goals / functional outcomes: Pt met 4 of 5 LTGs with reports of significantly improved pain allowing her to engage in ADLs and only experience pain with force during ADLs/IADLs such as cutting food.   Remaining deficits: Pain with force   Education / Equipment: Educated on splint wear and care, Ice massage and other pain management strategies, HEP   Patient agrees to discharge. Patient goals were met. Patient is being discharged due to meeting the stated rehab goals.Marland Kitchen     Simonne Come, OT 01/12/2022, 1:26 PM  [facility] [address], [place], [place], [address] Phone: [phone]   Fax:  [phone]

## 2022-07-04 NOTE — Progress Notes (Unsigned)
HPI: [name] is a 57 y.o. female, who is here today for her routine physical.  Last CPE: 06/30/21  Regular exercise 3 or more time per week: *** Following a healthy diet: ***  Chronic medical problems: ***  Immunization History  Administered Date(s) Administered  . Influenza,inj,Quad PF,6+ Mos 04/29/2020  . Janssen (J&J) SARS-COV-2 Vaccination 07/27/2019  . Tdap 08/30/2012   Health Maintenance  Topic Date Due  . Zoster Vaccines- Shingrix (1 of 2) Never done  . INFLUENZA VACCINE  11/16/2021  . COVID-19 Vaccine (2 - 2023-24 season) 12/17/2021  . HIV Screening  11/04/2024 (Originally [birth year]1981)  . DTaP/Tdap/Td (2 - Td or Tdap) 08/31/2022  . MAMMOGRAM  07/23/2023  . COLONOSCOPY (Pts 45-35yrs Insurance coverage will need to be confirmed)  01/18/2028  . Hepatitis C Screening  Completed  . HPV VACCINES  Aged Out  . PAP SMEAR-Modifier  Discontinued    She has *** concerns today.  Review of Systems  No current outpatient medications on file prior to visit.   No current facility-administered medications on file prior to visit.    Past Medical History:  Diagnosis Date  . Allergy   . Chicken pox denies    Past Surgical History:  Procedure Laterality Date  . ABDOMINAL HYSTERECTOMY      Allergies  Allergen Reactions  . Pollen Extract     Family History  Problem Relation Age of Onset  . Diabetes Mother   . Cancer Father   . Cancer Sister   . Asthma Daughter   . Alcohol abuse Maternal Grandfather   . Heart attack Maternal Grandfather     Social History   Socioeconomic History  . Marital status: Married    Spouse name: Not on file  . Number of children: Not on file  . Years of education: Not on file  . Highest education level: Not on file  Occupational History  . Not on file  Tobacco Use  . Smoking status: Never  . Smokeless tobacco: Never  Vaping Use  . Vaping Use: Never used  Substance and Sexual Activity  . Alcohol use: Never  . Drug  use: Never  . Sexual activity: Not on file  Other Topics Concern  . Not on file  Social History Narrative  . Not on file   Social Determinants of Health   Financial Resource Strain: Not on file  Food Insecurity: Not on file  Transportation Needs: Not on file  Physical Activity: Not on file  Stress: Not on file  Social Connections: Not on file    There were no vitals filed for this visit. There is no height or weight on file to calculate BMI.  Wt Readings from Last 3 Encounters:  06/30/21 146 lb 6 oz (66.4 kg)  04/29/20 146 lb (66.2 kg)  04/19/20 145 lb (65.8 kg)    Physical Exam Vitals and nursing note reviewed.  Constitutional:      General: She is not in acute distress.    Appearance: She is well-developed.  HENT:     Head: Normocephalic and atraumatic.     Right Ear: Hearing, tympanic membrane, ear canal and external ear normal.     Left Ear: Hearing, tympanic membrane, ear canal and external ear normal.     Mouth/Throat:     Mouth: Mucous membranes are moist.     Pharynx: Oropharynx is clear. Uvula midline.  Eyes:     Extraocular Movements: Extraocular movements intact.     Conjunctiva/sclera: Conjunctivae normal.  Pupils: Pupils are equal, round, and reactive to light.  Neck:     Thyroid: No thyromegaly.     Trachea: No tracheal deviation.  Cardiovascular:     Rate and Rhythm: Normal rate and regular rhythm.     Pulses:          Dorsalis pedis pulses are 2+ on the right side and 2+ on the left side.       Posterior tibial pulses are 2+ on the right side and 2+ on the left side.     Heart sounds: No murmur heard. Pulmonary:     Effort: Pulmonary effort is normal. No respiratory distress.     Breath sounds: Normal breath sounds.  Abdominal:     Palpations: Abdomen is soft. There is no hepatomegaly or mass.     Tenderness: There is no abdominal tenderness.  Genitourinary:    Comments: Deferred to gyn. Musculoskeletal:     Comments: No major deformity  or signs of synovitis appreciated.  Lymphadenopathy:     Cervical: No cervical adenopathy.     Upper Body:     Right upper body: No supraclavicular adenopathy.     Left upper body: No supraclavicular adenopathy.  Skin:    General: Skin is warm.     Findings: No erythema or rash.  Neurological:     General: No focal deficit present.     Mental Status: She is alert and oriented to person, place, and time.     Cranial Nerves: No cranial nerve deficit.     Coordination: Coordination normal.     Gait: Gait normal.     Deep Tendon Reflexes:     Reflex Scores:      Bicep reflexes are 2+ on the right side and 2+ on the left side.      Patellar reflexes are 2+ on the right side and 2+ on the left side. Psychiatric:     Comments: Well groomed, good eye contact.   ASSESSMENT AND PLAN: Ms. Laura Daniel was here today annual physical examination.  No orders of the defined types were placed in this encounter.   There are no diagnoses linked to this encounter.  There are no diagnoses linked to this encounter.  No follow-ups on file.  Kadeja Granada G. Martinique, MD  [facility]. [place] office.

## 2022-07-05 ENCOUNTER — Encounter: Payer: Self-pay | Admitting: Family Medicine

## 2022-07-05 ENCOUNTER — Ambulatory Visit (INDEPENDENT_AMBULATORY_CARE_PROVIDER_SITE_OTHER): Payer: BC Managed Care – PPO | Admitting: Family Medicine

## 2022-07-05 VITALS — BP 126/80 | HR 91 | Temp 98.2°F | Resp 16 | Ht 61.0 in | Wt 151.5 lb

## 2022-07-05 DIAGNOSIS — Z0000 Encounter for general adult medical examination without abnormal findings: Secondary | ICD-10-CM | POA: Diagnosis not present

## 2022-07-05 DIAGNOSIS — H1013 Acute atopic conjunctivitis, bilateral: Secondary | ICD-10-CM

## 2022-07-05 DIAGNOSIS — Z23 Encounter for immunization: Secondary | ICD-10-CM

## 2022-07-05 DIAGNOSIS — K760 Fatty (change of) liver, not elsewhere classified: Secondary | ICD-10-CM | POA: Diagnosis not present

## 2022-07-05 DIAGNOSIS — E785 Hyperlipidemia, unspecified: Secondary | ICD-10-CM | POA: Diagnosis not present

## 2022-07-05 DIAGNOSIS — Z13228 Encounter for screening for other metabolic disorders: Secondary | ICD-10-CM

## 2022-07-05 DIAGNOSIS — R7303 Prediabetes: Secondary | ICD-10-CM

## 2022-07-05 DIAGNOSIS — Z13220 Encounter for screening for lipoid disorders: Secondary | ICD-10-CM

## 2022-07-05 LAB — LIPID PANEL
Cholesterol: 175 mg/dL (ref 0–200)
HDL: 53.2 mg/dL (ref >39.00)
LDL Cholesterol: 88 mg/dL (ref 0–99)
NonHDL: 121.61
Total CHOL/HDL Ratio: 3
Triglycerides: 168 mg/dL — ABNORMAL HIGH (ref 0.0–149.0)
VLDL: 33.6 mg/dL (ref 0.0–40.0)

## 2022-07-05 LAB — BASIC METABOLIC PANEL
BUN: 17 mg/dL (ref 6–23)
CO2: 27 mEq/L (ref 19–32)
Calcium: 9.7 mg/dL (ref 8.4–10.5)
Chloride: 103 mEq/L (ref 96–112)
Creatinine, Ser: 0.7 mg/dL (ref 0.40–1.20)
GFR: 96.9 mL/min (ref >60.00)
Glucose, Bld: 102 mg/dL — ABNORMAL HIGH (ref 70–99)
Potassium: 4.1 mEq/L (ref 3.5–5.1)
Sodium: 139 mEq/L (ref 135–145)

## 2022-07-05 LAB — HEPATIC FUNCTION PANEL
ALT: 68 U/L — ABNORMAL HIGH (ref 0–35)
AST: 31 U/L (ref 0–37)
Albumin: 4.1 g/dL (ref 3.5–5.2)
Alkaline Phosphatase: 80 U/L (ref 39–117)
Bilirubin, Direct: 0.1 mg/dL (ref 0.0–0.3)
Total Bilirubin: 0.5 mg/dL (ref 0.2–1.2)
Total Protein: 7 g/dL (ref 6.0–8.3)

## 2022-07-05 LAB — HEMOGLOBIN A1C: Hgb A1c MFr Bld: 5.8 % (ref 4.6–6.5)

## 2022-07-05 MED ORDER — CROMOLYN SODIUM 4 % OP SOLN: 1.0000 [drp] | Freq: Four times a day (QID) | OPHTHALMIC | 3 refills | Status: DC | PRN

## 2022-07-05 NOTE — Assessment & Plan Note (Signed)
Non pharmacologic treatment to continue for now. Further recommendations will be given according to 10 years CVD risk score and lipid panel numbers.  

## 2022-07-05 NOTE — Assessment & Plan Note (Addendum)
Last HgA1C 6.0 in 06/2021. Continue a healthy life style for diabetes prevention.

## 2022-07-05 NOTE — Assessment & Plan Note (Addendum)
We discussed the importance of regular physical activity and healthy diet for prevention of chronic illness and/or complications. Preventive guidelines reviewed. Vaccination: She has completed shingrix vaccination and Tdap given today. Ca++ and vit D supplementation recommended. Next CPE in a year.

## 2022-07-05 NOTE — Assessment & Plan Note (Signed)
Problem is not well controlled. She does not remember the name of medication she is using at this time, it is not helping. Recommend trying cromolyn sodium 1-2 drops qid prn. Continue OTC antihistaminic.

## 2022-07-05 NOTE — Patient Instructions (Addendum)
A few things to remember from today's visit:  Routine general medical examination at a health care facility  Screening for lipoid disorders - Plan: Lipid panel  Prediabetes - Plan: Hemoglobin [id number], Basic metabolic panel  NAFL (nonalcoholic fatty liver) - Plan: Hepatic function panel  Allergic conjunctivitis of both eyes  If you need refills for medications you take chronically, please call your pharmacy. Do not use My Chart to request refills or for acute issues that need immediate attention. If you send a my chart message, it may take a few days to be addressed, specially if I am not in the office.  Please be sure medication list is accurate. If a new problem present, please set up appointment sooner than planned today.  Mantenimiento de Technical sales engineer en [place] [employer], Female Adoptar un estilo de vida saludable y recibir atencin preventiva son importantes para promover la salud y Musician. Consulte al mdico sobre: El esquema adecuado para hacerse pruebas y exmenes peridicos. Cosas que puede hacer por su cuenta para prevenir enfermedades y [place] sano. Qu debo saber sobre la dieta, el peso y el ejercicio? Consuma una dieta saludable  Consuma una dieta que incluya muchas verduras, frutas, productos lcteos con bajo contenido de [place] y Advertising account planner. No consuma muchos alimentos ricos en grasas slidas, azcares agregados o sodio. Mantenga un peso saludable El ndice de masa muscular [facility]) se [place] para identificar problemas de [place]. Proporciona una estimacin de la grasa corporal basndose en el peso y la altura. Su mdico puede ayudarle a Radiation protection practitioner [place] y a Scientist, forensic o Theatre manager un peso saludable. Haga ejercicio con regularidad Haga ejercicio con regularidad. Esta es una de las prcticas ms importantes que puede hacer por su salud. La [employer] de los adultos deben seguir estas pautas: Optometrist, al menos, 150 minutos de actividad fsica por semana. El  ejercicio debe aumentar la frecuencia cardaca y Nature conservation officer transpirar (ejercicio de intensidad moderada). Hacer ejercicios de fortalecimiento por lo [employer] por semana. Agregue esto a su plan de ejercicio de intensidad moderada. Pase menos tiempo sentada. Incluso la actividad fsica ligera puede ser beneficiosa. Controle sus niveles de colesterol y lpidos en la sangre Comience a realizarse anlisis de lpidos y Research officer, trade union en la sangre a los 72 aos y luego reptalos cada 5 aos. Hgase controlar los niveles de colesterol con mayor frecuencia si: Sus niveles de lpidos y colesterol son altos. Es mayor de 26 aos. Presenta un alto riesgo de padecer enfermedades cardacas. Qu debo saber sobre las pruebas de deteccin del cncer? Segn su historia clnica y sus antecedentes familiares, es posible que deba realizarse pruebas de deteccin del cncer en diferentes edades. Esto puede incluir pruebas de deteccin de lo siguiente: Cncer de mama. Cncer de cuello uterino. Cncer colorrectal. Cncer de piel. Cncer de pulmn. Qu debo saber sobre la enfermedad cardaca, la diabetes y la hipertensin arterial? Presin arterial y enfermedad cardaca La hipertensin arterial causa enfermedades cardacas y [place] el riesgo de accidente cerebrovascular. Es ms probable que esto se manifieste en las personas que tienen lecturas de presin arterial alta o tienen sobrepeso. Hgase controlar la presin arterial: Cada 3 a 5 aos si tiene entre 18 y 76 aos. Todos los aos si es mayor de 40 aos. Diabetes Realcese exmenes de deteccin de la diabetes con regularidad. Este anlisis revisa el nivel de azcar en la sangre en [place]. Hgase las pruebas de deteccin: Cada tres aos despus de los 40 aos de edad si tiene un peso  normal y un bajo riesgo de padecer diabetes. Con ms frecuencia y a partir de [place] edad inferior si tiene sobrepeso o un alto riesgo de padecer diabetes. Qu debo saber sobre la  prevencin de infecciones? Hepatitis B Si tiene un riesgo ms alto de contraer hepatitis B, debe someterse a un examen de deteccin de este virus. Hable con el mdico para averiguar si tiene riesgo de contraer la infeccin por hepatitis B. Hepatitis C Se recomienda el anlisis a: [employer] 1945 y 1965. Todas las personas que tengan un riesgo de haber contrado hepatitis C. Enfermedades de transmisin sexual (ETS) Hgase las pruebas de Programme researcher, broadcasting/film/video de ITS, incluidas la gonorrea y la clamidia, si: Es sexualmente activa y es menor de 64 aos. Es mayor de 80 aos, y Investment banker, operational informa que corre riesgo de tener este tipo de infecciones. La actividad sexual ha cambiado desde que le hicieron la ltima prueba de deteccin y tiene un riesgo mayor de Best boy clamidia o Radio broadcast assistant. Pregntele al mdico si usted tiene riesgo. Pregntele al mdico si usted tiene un alto riesgo de Museum/gallery curator VIH. El mdico tambin puede recomendarle un medicamento recetado para ayudar a evitar la infeccin por el VIH. Si elige tomar medicamentos para prevenir el VIH, primero debe [employer] de deteccin del VIH. Luego debe hacerse anlisis cada 3 meses mientras est tomando los medicamentos. Embarazo Si est por dejar de Librarian, academic (fase premenopusica) y usted puede quedar [place], busque asesoramiento antes de [place]. Tome de 400 a 800 microgramos (mcg) de cido [employer] si [place]. Pida mtodos de control de la natalidad (anticonceptivos) si desea evitar un embarazo no deseado. Osteoporosis y [place] La osteoporosis es una enfermedad en la que los huesos pierden los minerales y la fuerza por el avance de la edad. El resultado pueden ser fracturas en los [place]. Si tiene 8 aos o ms, o si est en riesgo de sufrir osteoporosis y fracturas, pregunte a su mdico si debe: Hacerse pruebas de deteccin de prdida sea. Tomar un suplemento de calcio o de vitamina D para  reducir el riesgo de fracturas. Recibir terapia de reemplazo hormonal (TRH) para tratar los sntomas de la menopausia. Siga estas indicaciones en su casa: Consumo de alcohol No beba alcohol si: Su mdico le indica no hacerlo. Est embarazada, puede estar embarazada o est tratando de [place]. Si bebe alcohol: Limite la cantidad que bebe a lo siguiente: De 0 a 1 bebida por da. Sepa cunta cantidad de alcohol hay en las bebidas que toma. En los [place], una medida equivale a una botella de cerveza de 12 oz (355 ml), un vaso de vino de 5 oz (148 ml) o un vaso de una bebida alcohlica de alta graduacin de 1 oz (44 ml). Estilo de vida No consuma ningn producto que contenga nicotina o tabaco. Estos productos incluyen cigarrillos, tabaco para Higher education careers adviser y aparatos de vapeo, como los Psychologist, sport and exercise. Si necesita ayuda para dejar de consumir estos productos, consulte al mdico. No consuma drogas. No comparta agujas. Solicite ayuda a su mdico si necesita apoyo o informacin para abandonar las drogas. Indicaciones generales Realcese los estudios de rutina de la salud, dentales y de Public librarian. Capac. Infrmele a su mdico si: Se siente deprimida con frecuencia. Alguna vez ha sido vctima de [place] o no se siente seguro en su casa. Resumen Adoptar un estilo de vida saludable y recibir atencin preventiva son importantes para promover  la salud y [place]. Siga las instrucciones del mdico acerca de una dieta saludable, el ejercicio y la realizacin de pruebas o exmenes para Engineer, building services. Siga las instrucciones del mdico con respecto al control del colesterol y la presin arterial. Esta informacin no tiene Marine scientist el consejo del mdico. Asegrese de hacerle al mdico cualquier pregunta que tenga. Document Revised: 09/10/2020 Document Reviewed: 09/10/2020 Elsevier Patient Education  [place].

## 2022-07-05 NOTE — Assessment & Plan Note (Signed)
ALT 59 in 06/2021, rest LFT's normal. Problem has been stable. Continue low fat diet. Will continue following annually.

## 2023-02-08 ENCOUNTER — Encounter: Payer: Self-pay | Admitting: Family Medicine

## 2023-02-08 ENCOUNTER — Ambulatory Visit: Payer: BC Managed Care – PPO | Admitting: Family Medicine

## 2023-02-08 VITALS — BP 120/70 | HR 91 | Resp 12 | Ht 61.0 in | Wt 146.2 lb

## 2023-02-08 DIAGNOSIS — M542 Cervicalgia: Secondary | ICD-10-CM

## 2023-02-08 DIAGNOSIS — M5441 Lumbago with sciatica, right side: Secondary | ICD-10-CM | POA: Diagnosis not present

## 2023-02-08 MED ORDER — KETOROLAC TROMETHAMINE 60 MG/2ML IM SOLN
60.0000 mg | Freq: Once | INTRAMUSCULAR | Status: AC
  Administered 2023-02-08: 60 mg via INTRAMUSCULAR

## 2023-02-08 MED ORDER — CELECOXIB 100 MG PO CAPS: 100.0000 mg | ORAL_CAPSULE | Freq: Two times a day (BID) | ORAL | 0 refills | Status: AC

## 2023-02-08 MED ORDER — PREDNISONE 20 MG PO TABS: ORAL_TABLET | ORAL | 0 refills | Status: AC

## 2023-02-08 MED ORDER — TRAMADOL HCL 50 MG PO TABS: 50.0000 mg | ORAL_TABLET | Freq: Two times a day (BID) | ORAL | 0 refills | Status: AC | PRN

## 2023-02-08 NOTE — Progress Notes (Signed)
ACUTE VISIT Chief Complaint  Patient presents with   Back Pain    Since Sunday    HPI: [name] is a 57 y.o. female with a PMHx significant for HLD, prediabetes, seasonal allergies, and fatty liver, who is here today complaining of lower back pain with sciatica.   Back Pain This is a new problem. The problem is unchanged. The pain is present in the lumbar spine. The quality of the pain is described as aching. The pain is severe. The symptoms are aggravated by bending, lying down and position. Pertinent negatives include no abdominal pain, bladder incontinence, bowel incontinence or dysuria.    Patient complains of lower back pain since 10/20.  She has pain in her lower back and both sides, which radiates into her right hip and down her right leg.  She says the pain is constant but varying in quality, ranging from a 3-7/10 normally, and a 10/10 during the worst flare ups. She says the areas in pain feel warm.  She notes the pain is worsened by pressure or walking.  She says it has interfered with her sleep.  She mentions she had a similar pain in 2009, for which she received epidural injections.  She has been taking tylenol for her pain.  Pertinent negatives include numbness, tingling, urinary changes or incontinence, or bowel changes.   Review of Systems  Gastrointestinal:  Negative for abdominal pain and bowel incontinence.  Genitourinary:  Negative for bladder incontinence and dysuria.  Musculoskeletal:  Positive for back pain.   See other pertinent positives and negatives in HPI.  Current Outpatient Medications on File Prior to Visit  Medication Sig Dispense Refill   cromolyn (OPTICROM) 4 % ophthalmic solution Place 1-2 drops into both eyes 4 (four) times daily as needed. 10 mL 3   No current facility-administered medications on file prior to visit.    Past Medical History:  Diagnosis Date   Allergy    Chicken pox denies   Allergies  Allergen Reactions    Pollen Extract     Social History   Socioeconomic History   Marital status: Married    Spouse name: Not on file   Number of children: Not on file   Years of education: Not on file   Highest education level: Not on file  Occupational History   Not on file  Tobacco Use   Smoking status: Never   Smokeless tobacco: Never  Vaping Use   Vaping status: Never Used  Substance and Sexual Activity   Alcohol use: Never   Drug use: Never   Sexual activity: Not on file  Other Topics Concern   Not on file  Social History Narrative   Not on file   Social Determinants of Health   Financial Resource Strain: Not on file  Food Insecurity: No Food Insecurity (06/29/2021)   Received from [facility]   Hunger Vital Sign    Worried About Running Out of Food in the Last Year: Never true    Ran Out of Food in the Last Year: Never true  Transportation Needs: Not on file  Physical Activity: Not on file  Stress: Not on file  Social Connections: Unknown (08/31/2021)   [facility]   Social Network    Social Network: Not on file    Vitals:   02/08/23 1152  BP: 120/70  Pulse: 91  Resp: 12  SpO2: 100%   Body mass index is 27.63 kg/m.  Physical  Exam Vitals and nursing note reviewed.  Constitutional:      General: She is not in acute distress.    Appearance: She is well-developed.  HENT:     Head: Normocephalic and atraumatic.  Eyes:     Conjunctiva/sclera: Conjunctivae normal.  Cardiovascular:     Rate and Rhythm: Normal rate and regular rhythm.     Pulses:          Dorsalis pedis pulses are 2+ on the right side and 2+ on the left side.     Heart sounds: No murmur heard. Pulmonary:     Effort: Pulmonary effort is normal. No respiratory distress.     Breath sounds: Normal breath sounds.  Abdominal:     Palpations: Abdomen is soft. There is no mass.     Tenderness: There is no abdominal tenderness.  Musculoskeletal:     Cervical  back: Tenderness present.     Lumbar back: Tenderness present. No bony tenderness. Negative right straight leg raise test and negative left straight leg raise test.       Back:     Right hip: Tenderness present.     Right lower leg: No edema.     Left lower leg: No edema.     Comments: Right SRL elicits right lower back pulling pain.  Skin:    General: Skin is warm.     Findings: No erythema or rash.  Neurological:     General: No focal deficit present.     Mental Status: She is alert and oriented to person, place, and time.     Cranial Nerves: No cranial nerve deficit.     Comments: Antalgic gait, not assisted.  Psychiatric:        Mood and Affect: Mood and affect normal.    ASSESSMENT AND PLAN:  Ms. Helmig was seen today for lower back pain with sciatica.   Acute bilateral low back pain with right-sided sciatica  Neck pain without injury  Other orders -     Celecoxib; Take 1 capsule (100 mg total) by mouth 2 (two) times daily for 10 days.  Dispense: 20 capsule; Refill: 0 -     predniSONE; 3 tabs for 3 days, 2 tabs for 3 days, 1 tabs for 3 days, and 1/2 tab for 3 days. Take tables together with breakfast.  Dispense: 20 tablet; Refill: 0 -     traMADol HCl; Take 1 tablet (50 mg total) by mouth every 12 (twelve) hours as needed for up to 7 days.  Dispense: 14 tablet; Refill: 0    Return if symptoms worsen or fail to improve, for chronic problems.  I, Laura Daniel, acting as a scribe for Laura Bert [place], MD., have documented all relevant documentation on the behalf of Laura Levee [place], MD, as directed by  Laura Reh [place], MD while in the presence of Laura Luth [place], MD.   I, Laura Bassett [place], MD, have reviewed all documentation for this visit. The documentation on 02/08/23 for the exam, diagnosis, procedures, and orders are all accurate and complete.  Laura Daniel G. [place], MD  [facility]. [place] office.

## 2023-02-08 NOTE — Patient Instructions (Addendum)
A few things to remember from today's visit:  Acute bilateral low back pain with right-sided sciatica  Neck pain without injury Empiece el celebrex en 8 horas. Tramadol en la noche si lo necesita para el dolor. Prednisona con el desayuno. Calor en el rea de dolor o asper cream tambin ayudan. Masaje en la area del trapezio. Tai chi o yoga tambien pueden ayudar a prevenir episodios.   Do not use My Chart to request refills or for acute issues that need immediate attention. If you send a my chart message, it may take a few days to be addressed, specially if I am not in the office.  Please be sure medication list is accurate. If a new problem present, please set up appointment sooner than planned today.

## 2023-07-17 ENCOUNTER — Encounter: Payer: Self-pay | Admitting: Family Medicine

## 2023-07-17 ENCOUNTER — Ambulatory Visit (INDEPENDENT_AMBULATORY_CARE_PROVIDER_SITE_OTHER): Admitting: Family Medicine

## 2023-07-17 VITALS — BP 118/80 | HR 88 | Temp 97.9°F | Resp 12 | Ht 61.0 in | Wt 151.0 lb

## 2023-07-17 DIAGNOSIS — Z130 Encounter for screening for diseases of the blood and blood-forming organs and certain disorders involving the immune mechanism: Secondary | ICD-10-CM

## 2023-07-17 DIAGNOSIS — Z1329 Encounter for screening for other suspected endocrine disorder: Secondary | ICD-10-CM

## 2023-07-17 DIAGNOSIS — H1013 Acute atopic conjunctivitis, bilateral: Secondary | ICD-10-CM

## 2023-07-17 DIAGNOSIS — Z0000 Encounter for general adult medical examination without abnormal findings: Secondary | ICD-10-CM

## 2023-07-17 DIAGNOSIS — E785 Hyperlipidemia, unspecified: Secondary | ICD-10-CM

## 2023-07-17 DIAGNOSIS — Z23 Encounter for immunization: Secondary | ICD-10-CM | POA: Diagnosis not present

## 2023-07-17 DIAGNOSIS — E663 Overweight: Secondary | ICD-10-CM

## 2023-07-17 DIAGNOSIS — Z13228 Encounter for screening for other metabolic disorders: Secondary | ICD-10-CM | POA: Diagnosis not present

## 2023-07-17 MED ORDER — CROMOLYN SODIUM 4 % OP SOLN: 1.0000 [drp] | Freq: Four times a day (QID) | OPHTHALMIC | 3 refills | Status: AC | PRN

## 2023-07-17 NOTE — Assessment & Plan Note (Signed)
 Continue nonpharmacologic treatment. Further recommendations will be given according to 10 years CVD risk score and lipid panel numbers.

## 2023-07-17 NOTE — Assessment & Plan Note (Signed)
 We discussed the importance of regular physical activity and healthy diet for prevention of chronic illness and/or complications. Preventive guidelines reviewed. Vaccination updated, Prevnar 20 given today. Status post hysterectomy. She is planning on arranging her mammogram. Next CPE in a year.

## 2023-07-17 NOTE — Assessment & Plan Note (Addendum)
 Cromolyn eyedrops have helped in the past, new prescription sent to continue 1 to 2 drops up to 4 times per day.

## 2023-07-17 NOTE — Progress Notes (Unsigned)
 HPI: [name] is a 58 y.o. female with a PMHx significant for prediabetes, seasonal allergies, and fatty liver, who is here today for her routine physical.  Last CPE: 07/05/2022  Exercise: Patient states she is doing yoga a few times per week and walking about 2 miles five times per week.  Diet: Patient states that she only eats two meals per day. She eats vegetables daily, and eats healthy in general, but admits she has some room for improvement. Frustrated because she is not losing wt. Sleep: 8 hours per night.  Alcohol Use: She has wine 2-3 times per month.   Smoking: never Vision:  UTD on routine vision care.  Dental: UTD on routine dental care.   Immunization History  Administered Date(s) Administered   Influenza,inj,Quad PF,6+ Mos 04/29/2020   Janssen (J&J) SARS-COV-2 Vaccination 07/27/2019   PNEUMOCOCCAL CONJUGATE-20 07/17/2023   Tdap 08/30/2012, 07/05/2022   Health Maintenance  Topic Date Due   Zoster Vaccines- Shingrix (1 of 2) Never done   INFLUENZA VACCINE  07/17/2023 (Originally 11/17/2022)   COVID-19 Vaccine (2 - 2024-25 season) 08/02/2023 (Originally 12/18/2022)   HIV Screening  11/04/2024 (Originally 05/31/1980)   MAMMOGRAM  07/23/2023   Colonoscopy  01/18/2028   DTaP/Tdap/Td (3 - Td or Tdap) 07/04/2032   Hepatitis C Screening  Completed   Pneumococcal Vaccine 65-26 Years old  Aged Out   HPV VACCINES  Aged Out   Chronic medical problems:   HLD on non pharmacologic treatment.  Lab Results  Component Value Date   CHOL 175 07/05/2022   HDL 53.20 07/05/2022   LDLCALC 88 07/05/2022   TRIG 168.0 (H) 07/05/2022   CHOLHDL 3 07/05/2022   Lab Results  Component Value Date   HGBA1C 5.8 07/05/2022   She complains of occasional burning and itching of her eyes from allergies. Symptoms are usually during Spring, cromolyn eye drops has helped.  Review of Systems  Constitutional:  Negative for activity change, appetite change and fever.  HENT:  Negative for  mouth sores, sore throat and trouble swallowing.   Eyes:  Negative for redness and visual disturbance.  Respiratory:  Negative for cough, shortness of breath and wheezing.   Cardiovascular:  Negative for chest pain and leg swelling.  Gastrointestinal:  Negative for abdominal pain, nausea and vomiting.       No changes in bowel habits.  Endocrine: Negative for cold intolerance, heat intolerance, polydipsia, polyphagia and polyuria.  Genitourinary:  Negative for decreased urine volume, dysuria and hematuria.  Musculoskeletal:  Negative for gait problem and myalgias.  Skin:  Negative for color change and rash.  Allergic/Immunologic: Positive for environmental allergies.  Neurological:  Negative for syncope, weakness and headaches.  Hematological:  Negative for adenopathy. Does not bruise/bleed easily.  Psychiatric/Behavioral:  Negative for confusion. The patient is not nervous/anxious.   All other systems reviewed and are negative.  No current outpatient medications on file prior to visit.   No current facility-administered medications on file prior to visit.   Past Medical History:  Diagnosis Date   Allergy    Chicken pox denies   Past Surgical History:  Procedure Laterality Date   ABDOMINAL HYSTERECTOMY      Allergies  Allergen Reactions   Pollen Extract     Family History  Problem Relation Age of Onset   Diabetes Mother    Cancer Father    Cancer Sister    Asthma Daughter    Alcohol abuse Maternal Grandfather    Heart attack Maternal Grandfather  Social History   Socioeconomic History   Marital status: Married    Spouse name: Not on file   Number of children: Not on file   Years of education: Not on file   Highest education level: Not on file  Occupational History   Not on file  Tobacco Use   Smoking status: Never   Smokeless tobacco: Never  Vaping Use   Vaping status: Never Used  Substance and Sexual Activity   Alcohol use: Never   Drug use: Never    Sexual activity: Not on file  Other Topics Concern   Not on file  Social History Narrative   Not on file   Social Drivers of Health   Financial Resource Strain: Not on file  Food Insecurity: No Food Insecurity (06/29/2021)   [facility]   Hunger Vital Sign    Worried About Running Out of Food in the Last Year: Never true    Ran Out of Food in the Last Year: Never true  Transportation Needs: Not on file  Physical Activity: Not on file  Stress: Not on file  Social Connections: Unknown (08/31/2021)   Received from [facility]   Social Network    Social Network: Not on file   Vitals:   07/17/23 1509  BP: 118/80  Pulse: 88  Resp: 12  Temp: 97.9 F (36.6 C)  SpO2: 98%   Body mass index is 28.53 kg/m.  Wt Readings from Last 3 Encounters:  07/17/23 151 lb (68.5 kg)  02/08/23 146 lb 4 oz (66.3 kg)  07/05/22 151 lb 8 oz (68.7 kg)   Physical Exam Vitals and nursing note reviewed.  Constitutional:      General: She is not in acute distress.    Appearance: She is well-developed.  HENT:     Head: Normocephalic and atraumatic.     Right Ear: External ear normal. Tympanic membrane is not erythematous.     Left Ear: External ear normal. Tympanic membrane is not erythematous.     Ears:     Comments: Cerumen excess in ear canals, R>L. TM's seen partially.    Mouth/Throat:     Mouth: Mucous membranes are moist.     Pharynx: Oropharynx is clear. Uvula midline.  Eyes:     Extraocular Movements: Extraocular movements intact.     Conjunctiva/sclera: Conjunctivae normal.     Pupils: Pupils are equal, round, and reactive to light.  Neck:     Thyroid: No thyroid mass or thyromegaly.  Cardiovascular:     Rate and Rhythm: Normal rate and regular rhythm.     Pulses:          Dorsalis pedis pulses are 2+ on the right side and 2+ on the left side.     Heart sounds: No murmur heard. Pulmonary:     Effort: Pulmonary effort is normal. No  respiratory distress.     Breath sounds: Normal breath sounds.  Abdominal:     Palpations: Abdomen is soft. There is no hepatomegaly or mass.     Tenderness: There is no abdominal tenderness.  Genitourinary:    Comments: No concerns today. Musculoskeletal:     Right lower leg: No edema.     Left lower leg: No edema.     Comments: No signs of synovitis appreciated.  Lymphadenopathy:     Cervical: No cervical adenopathy.     Upper Body:     Right upper body: No supraclavicular adenopathy.  Left upper body: No supraclavicular adenopathy.  Skin:    General: Skin is warm.     Findings: No erythema or rash.  Neurological:     General: No focal deficit present.     Mental Status: She is alert and oriented to person, place, and time.     Cranial Nerves: No cranial nerve deficit.     Sensory: No sensory deficit.     Motor: No weakness.     Coordination: Coordination normal.     Gait: Gait normal.     Deep Tendon Reflexes:     Reflex Scores:      Bicep reflexes are 2+ on the right side and 2+ on the left side.      Patellar reflexes are 2+ on the right side and 2+ on the left side. Psychiatric:        Mood and Affect: Mood and affect normal.   ASSESSMENT AND PLAN:  Ms. Laura Daniel was here today for her annual physical examination.  Orders Placed This Encounter  Procedures   Pneumococcal conjugate vaccine 20-valent   Comprehensive metabolic panel with GFR   Hemoglobin A1c   Lipid panel   TSH   Lab Results  Component Value Date   TSH 1.77 07/17/2023   Lab Results  Component Value Date   CHOL 202 (H) 07/17/2023   HDL 76.60 07/17/2023   LDLCALC 103 (H) 07/17/2023   TRIG 110.0 07/17/2023   CHOLHDL 3 07/17/2023   Lab Results  Component Value Date   NA 139 07/17/2023   CL 102 07/17/2023   K 3.7 07/17/2023   CO2 26 07/17/2023   BUN 17 07/17/2023   CREATININE 0.76 07/17/2023   GFR 87.16 07/17/2023   CALCIUM 9.5 07/17/2023   ALBUMIN 4.5 07/17/2023   GLUCOSE 79  07/17/2023   Lab Results  Component Value Date   ALT 38 (H) 07/17/2023   AST 25 07/17/2023   ALKPHOS 95 07/17/2023   BILITOT 0.4 07/17/2023   Lab Results  Component Value Date   HGBA1C 5.7 07/17/2023   Routine general medical examination at a health care facility Assessment & Plan: We discussed the importance of regular physical activity and healthy diet for prevention of chronic illness and/or complications. Preventive guidelines reviewed. Vaccination updated, Prevnar 20 given today. Status post hysterectomy. She is planning on arranging her mammogram. Next CPE in a year.  Hyperlipidemia, unspecified hyperlipidemia type Assessment & Plan: Continue nonpharmacologic treatment. Further recommendations will be given according to 10 years CVD risk score and lipid panel numbers.  Orders: -     Comprehensive metabolic panel with GFR; Future -     Lipid panel; Future -     TSH; Future -     Pneumococcal conjugate vaccine 20-valent  Screening for endocrine, metabolic and immunity disorder -     Hemoglobin A1c; Future  Allergic conjunctivitis of both eyes Assessment & Plan: Cromolyn eyedrops have helped in the past, new prescription sent to continue 1 to 2 drops up to 4 times per day.  Orders: -     Cromolyn Sodium; Place 1-2 drops into both eyes 4 (four) times daily as needed.  Dispense: 10 mL; Refill: 3  Overweight (BMI 25.0-29.9) Concerned about not been able to lose wt. Recommend trying smaller meals more time per day and decrease portions of carbs.  -     TSH; Future  Return in 1 year (on 07/16/2024) for CPE.  I, Rolla Etienne Wierda, acting as a Neurosurgeon for [employer]  [place], MD., have documented all relevant documentation on the behalf of Sally-Anne Wamble [place], MD, as directed by  Laquan Beier [place], MD while in the presence of Shametra Cumberland [place], MD.   I, Zaynah Chawla [place], MD, have reviewed all documentation for this visit. The documentation on 07/17/23 for the exam, diagnosis, procedures, and orders  are all accurate and complete.  Akiko Schexnider G. [place], MD  [facility] [place] office.

## 2023-07-17 NOTE — Patient Instructions (Addendum)
 A few things to remember from today's visit:  Routine general medical examination at a health care facility  Hyperlipidemia, unspecified hyperlipidemia type  Screening for endocrine, metabolic and immunity disorder  Allergic conjunctivitis of both eyes - Plan: cromolyn (OPTICROM) 4 % ophthalmic solution  If you need refills for medications you take chronically, please call your pharmacy. Do not use My Chart to request refills or for acute issues that need immediate attention. If you send a my chart message, it may take a few days to be addressed, specially if I am not in the office.  Please be sure medication list is accurate. If a new problem present, please set up appointment sooner than planned today.  Mantenimiento de Radiographer, therapeutic en las mujeres Health Maintenance, Female Adoptar un estilo de vida saludable y recibir atencin preventiva son importantes para promover la salud y Counsellor. Consulte al mdico sobre: El esquema adecuado para hacerse pruebas y exmenes peridicos. Cosas que puede hacer por su cuenta para prevenir enfermedades y [place] sano. Qu debo saber sobre la dieta, el peso y el ejercicio? Consuma una dieta saludable  Consuma una dieta que incluya muchas verduras, frutas, productos lcteos con bajo contenido de [place] y Associate Professor. No consuma muchos alimentos ricos en grasas slidas, azcares agregados o sodio. Mantenga un peso saludable El ndice de masa muscular [facility]) se [place] para identificar problemas de [place]. Proporciona una estimacin de la grasa corporal basndose en el peso y la altura. Su mdico puede ayudarle a Engineer, site IMC y a Personnel officer o Pharmacologist un peso saludable. Haga ejercicio con regularidad Haga ejercicio con regularidad. Esta es una de las prcticas ms importantes que puede hacer por su salud. La [employer] de los adultos deben seguir estas pautas: Education officer, environmental, al menos, 150 minutos de actividad fsica por semana. El ejercicio debe  aumentar la frecuencia cardaca y Media planner transpirar (ejercicio de intensidad moderada). Hacer ejercicios de fortalecimiento por lo [employer] por semana. Agregue esto a su plan de ejercicio de intensidad moderada. Pase menos tiempo sentada. Incluso la actividad fsica ligera puede ser beneficiosa. Controle sus niveles de colesterol y lpidos en la sangre Comience a realizarse anlisis de lpidos y Oncologist en la sangre a los 20 aos y luego reptalos cada 5 aos. Hgase controlar los niveles de colesterol con mayor frecuencia si: Sus niveles de lpidos y colesterol son altos. Es mayor de 40 aos. Presenta un alto riesgo de padecer enfermedades cardacas. Qu debo saber sobre las pruebas de deteccin del cncer? Segn su historia clnica y sus antecedentes familiares, es posible que deba realizarse pruebas de deteccin del cncer en diferentes edades. Esto puede incluir pruebas de deteccin de lo siguiente: Cncer de mama. Cncer de cuello uterino. Cncer colorrectal. Cncer de piel. Cncer de pulmn. Qu debo saber sobre la enfermedad cardaca, la diabetes y la hipertensin arterial? Presin arterial y enfermedad cardaca La hipertensin arterial causa enfermedades cardacas y [place] el riesgo de accidente cerebrovascular. Es ms probable que esto se manifieste en las personas que tienen lecturas de presin arterial alta o tienen sobrepeso. Hgase controlar la presin arterial: Cada 3 a 5 aos si tiene entre 18 y 68 aos. Todos los aos si es mayor de 40 aos. Diabetes Realcese exmenes de deteccin de la diabetes con regularidad. Este anlisis revisa el nivel de azcar en la sangre en [place]. Hgase las pruebas de deteccin: Cada tres aos despus de los 40 aos de edad si tiene un peso normal y un bajo riesgo de Marine scientist  diabetes. Con ms frecuencia y a partir de [place] edad inferior si tiene sobrepeso o un alto riesgo de padecer diabetes. Qu debo saber sobre la prevencin de  infecciones? Hepatitis B Si tiene un riesgo ms alto de contraer hepatitis B, debe someterse a un examen de deteccin de este virus. Hable con el mdico para averiguar si tiene riesgo de contraer la infeccin por hepatitis B. Hepatitis C Se recomienda el anlisis a: [employer] 1944 y 1965. Todas las personas que tengan un riesgo de haber contrado hepatitis C. Enfermedades de transmisin sexual (ETS) Hgase las pruebas de Airline pilot de ITS, incluidas la gonorrea y la clamidia, si: Es sexualmente activa y es menor de [address]. Es mayor de [address], y Public affairs consultant informa que corre riesgo de tener este tipo de infecciones. La actividad sexual ha cambiado desde que le hicieron la ltima prueba de deteccin y tiene un riesgo mayor de Warehouse manager clamidia o Copy. Pregntele al mdico si usted tiene riesgo. Pregntele al mdico si usted tiene un alto riesgo de Primary school teacher VIH. El mdico tambin puede recomendarle un medicamento recetado para ayudar a evitar la infeccin por el VIH. Si elige tomar medicamentos para prevenir el VIH, primero debe [employer] de deteccin del VIH. Luego debe hacerse anlisis cada 3 meses mientras est tomando los medicamentos. Embarazo Si est por dejar de Armed forces training and education officer (fase premenopusica) y usted puede quedar [place], busque asesoramiento antes de [place]. T[place] de 400 a 800 microgramos (mcg) de cido [employer] si [place]. Pida mtodos de control de la natalidad (anticonceptivos) si desea evitar un embarazo no deseado. Osteoporosis y [place] La osteoporosis es una enfermedad en la que los huesos pierden los minerales y la fuerza por el avance de la edad. El resultado pueden ser fracturas en los [place]. Si tiene 65 aos o ms, o si est en riesgo de sufrir osteoporosis y fracturas, pregunte a su mdico si debe: Hacerse pruebas de deteccin de prdida sea. Tomar un suplemento de calcio o de vitamina D para reducir el  riesgo de fracturas. Recibir terapia de reemplazo hormonal (TRH) para tratar los sntomas de la menopausia. Siga estas indicaciones en su casa: Consumo de alcohol No beba alcohol si: Su mdico le indica no hacerlo. Est embarazada, puede estar embarazada o est tratando de [place]. Si bebe alcohol: Limite la cantidad que bebe a lo siguiente: De 0 a 1 bebida por da. Sepa cunta cantidad de alcohol hay en las bebidas que toma. En los [address], una medida equivale a una botella de cerveza de 12 oz (355 ml), un vaso de vino de 5 oz (148 ml) o un vaso de una bebida alcohlica de alta graduacin de 1 oz (44 ml). Estilo de vida No consuma ningn producto que contenga nicotina o tabaco. Estos productos incluyen cigarrillos, tabaco para Theatre manager y aparatos de vapeo, como los Administrator, Civil Service. Si necesita ayuda para dejar de consumir estos productos, consulte al mdico. No consuma drogas. No comparta agujas. Solicite ayuda a su mdico si necesita apoyo o informacin para abandonar las drogas. Indicaciones generales Realcese los estudios de rutina de [address], dentales y de Wellsite geologist. Mantngase al da con las vacunas. Infrmele a su mdico si: Se siente deprimida con frecuencia. Alguna vez ha sido vctima de [place] o no se siente seguro en su casa. Resumen Adoptar un estilo de vida saludable y recibir atencin preventiva son importantes para promover la salud y Counsellor. Siga las  instrucciones del mdico acerca de una dieta saludable, el ejercicio y la realizacin de pruebas o exmenes para Hotel manager. Siga las instrucciones del mdico con respecto al control del colesterol y la presin arterial. Esta informacin no tiene Theme park manager el consejo del mdico. Asegrese de hacerle al mdico cualquier pregunta que tenga. Document Revised: 09/10/2020 Document Reviewed: 09/10/2020 Elsevier Patient Education  2024 [employer].

## 2023-07-18 ENCOUNTER — Encounter: Payer: Self-pay | Admitting: Family Medicine

## 2023-07-18 LAB — LIPID PANEL
Cholesterol: 202 mg/dL — ABNORMAL HIGH (ref 0–200)
HDL: 76.6 mg/dL (ref >39.00)
LDL Cholesterol: 103 mg/dL — ABNORMAL HIGH (ref 0–99)
NonHDL: 125.11
Total CHOL/HDL Ratio: 3
Triglycerides: 110 mg/dL (ref 0.0–149.0)
VLDL: 22 mg/dL (ref 0.0–40.0)

## 2023-07-18 LAB — COMPREHENSIVE METABOLIC PANEL WITH GFR
ALT: 38 U/L — ABNORMAL HIGH (ref 0–35)
AST: 25 U/L (ref 0–37)
Albumin: 4.5 g/dL (ref 3.5–5.2)
Alkaline Phosphatase: 95 U/L (ref 39–117)
BUN: 17 mg/dL (ref 6–23)
CO2: 26 meq/L (ref 19–32)
Calcium: 9.5 mg/dL (ref 8.4–10.5)
Chloride: 102 meq/L (ref 96–112)
Creatinine, Ser: 0.76 mg/dL (ref 0.40–1.20)
GFR: 87.16 mL/min (ref >60.00)
Glucose, Bld: 79 mg/dL (ref 70–99)
Potassium: 3.7 meq/L (ref 3.5–5.1)
Sodium: 139 meq/L (ref 135–145)
Total Bilirubin: 0.4 mg/dL (ref 0.2–1.2)
Total Protein: 7.4 g/dL (ref 6.0–8.3)

## 2023-07-18 LAB — HEMOGLOBIN A1C: Hgb A1c MFr Bld: 5.7 % (ref 4.6–6.5)

## 2023-07-18 LAB — TSH: TSH: 1.77 u[IU]/mL (ref 0.35–5.50)

## 2023-07-19 ENCOUNTER — Other Ambulatory Visit: Payer: Self-pay | Admitting: Family Medicine

## 2023-07-19 DIAGNOSIS — Z1231 Encounter for screening mammogram for malignant neoplasm of breast: Secondary | ICD-10-CM

## 2023-07-28 ENCOUNTER — Ambulatory Visit

## 2023-12-02 IMAGING — MG MM DIGITAL SCREENING BILAT W/ TOMO AND CAD
8 series · 9 of 24 positions shown · non-contrast
Comparison: Previous exam(s).

CLINICAL DATA: Screening.

EXAM:
DIGITAL SCREENING BILATERAL MAMMOGRAM WITH TOMOSYNTHESIS AND CAD
TECHNIQUE: Bilateral screening digital craniocaudal and mediolateral oblique
mammograms were obtained. Bilateral screening digital breast
tomosynthesis was performed. The images were evaluated with
computer-aided detection.

[L MLO synth-2D]
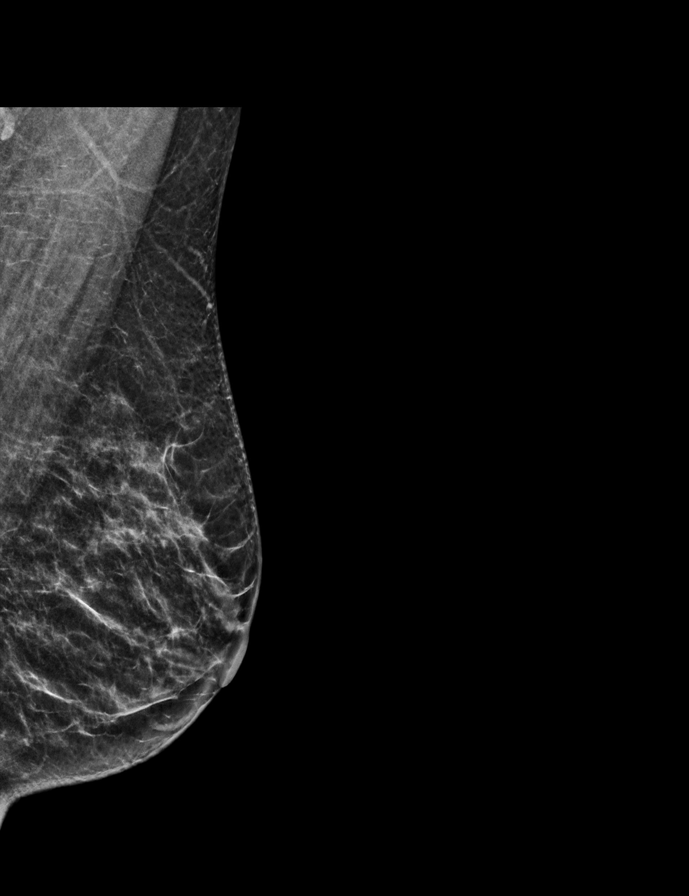

[R CC synth-2D]
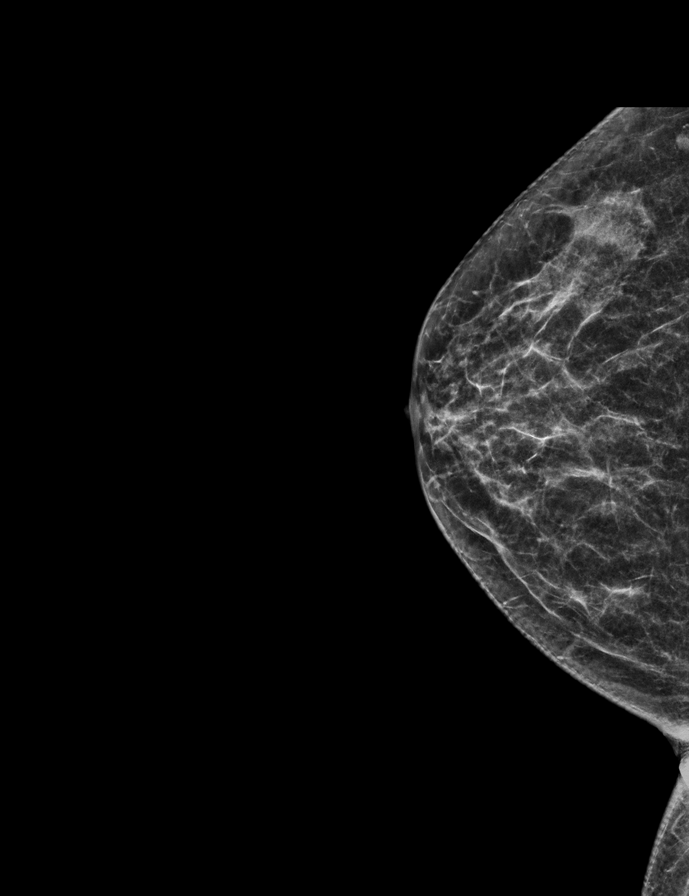

[L CC synth-2D]
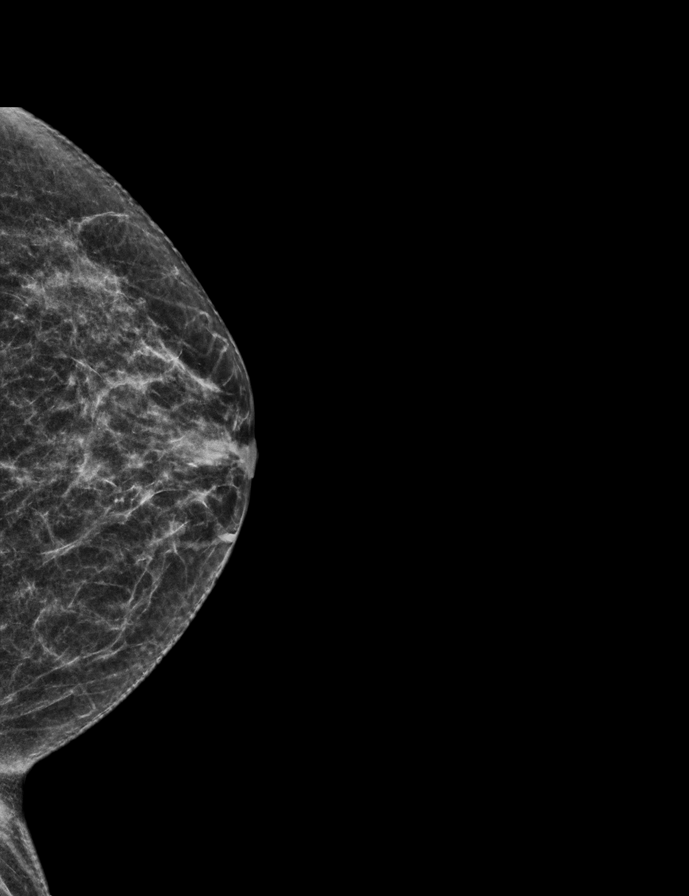

[R MLO synth-2D]
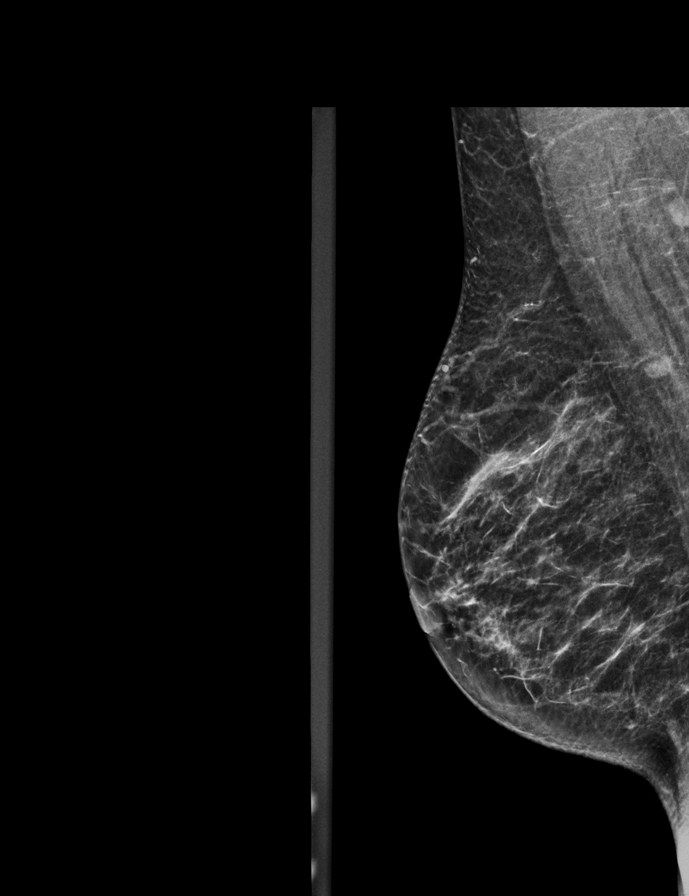

[L MLO tomo · 2 of 54 frames shown]
[frame 18/54]
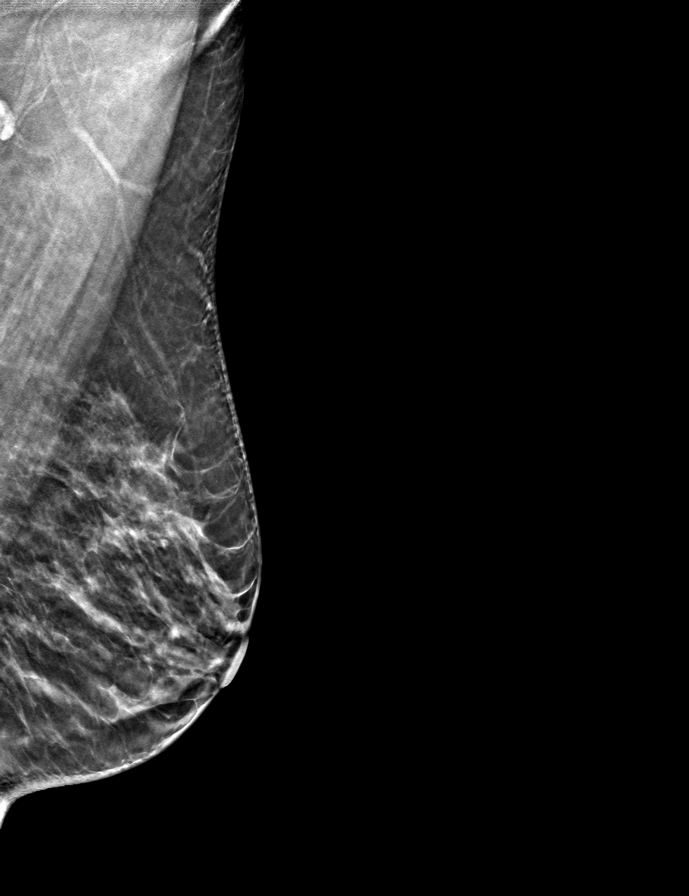
[frame 27/54]
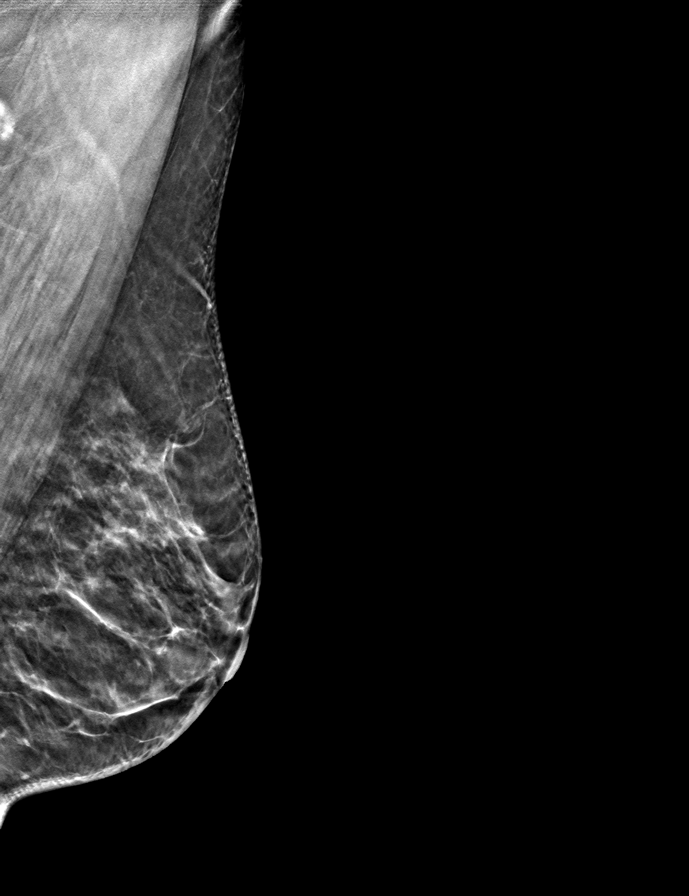

[R CC tomo · tomo slice 25/49.0]
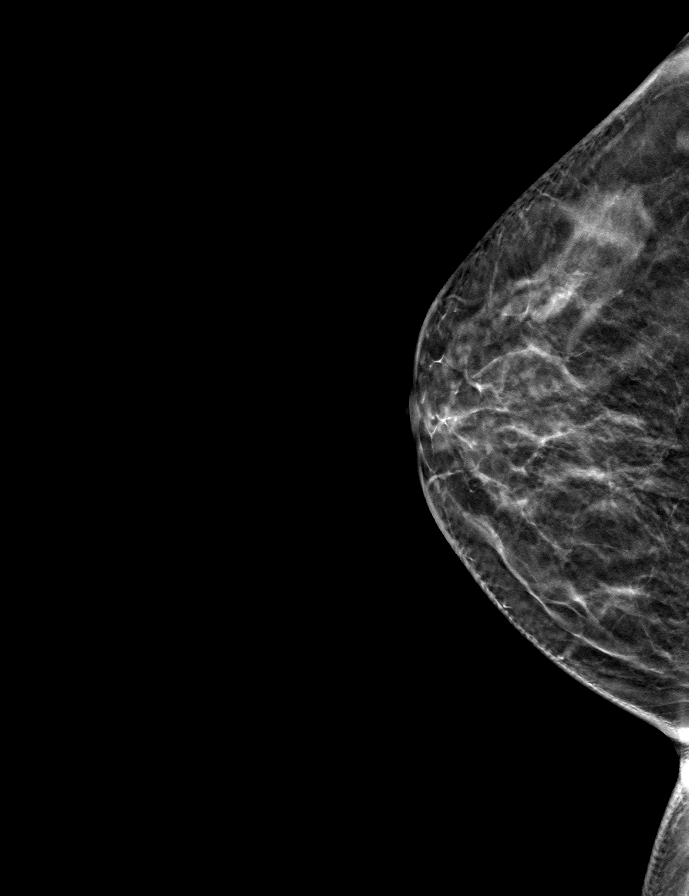

[L CC tomo · tomo slice 23/46.0]
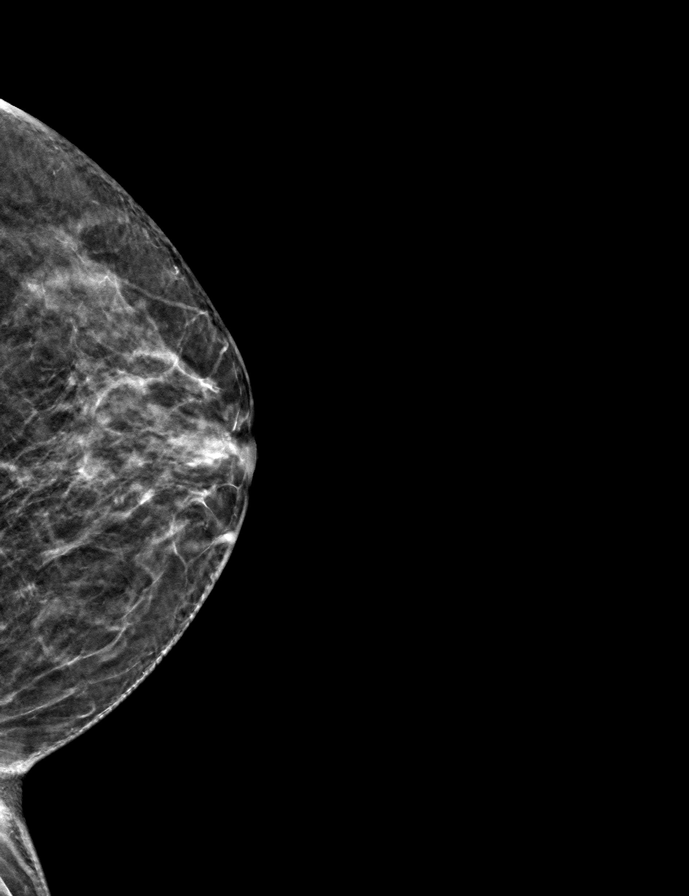

[R MLO tomo · tomo slice 29/56.0]
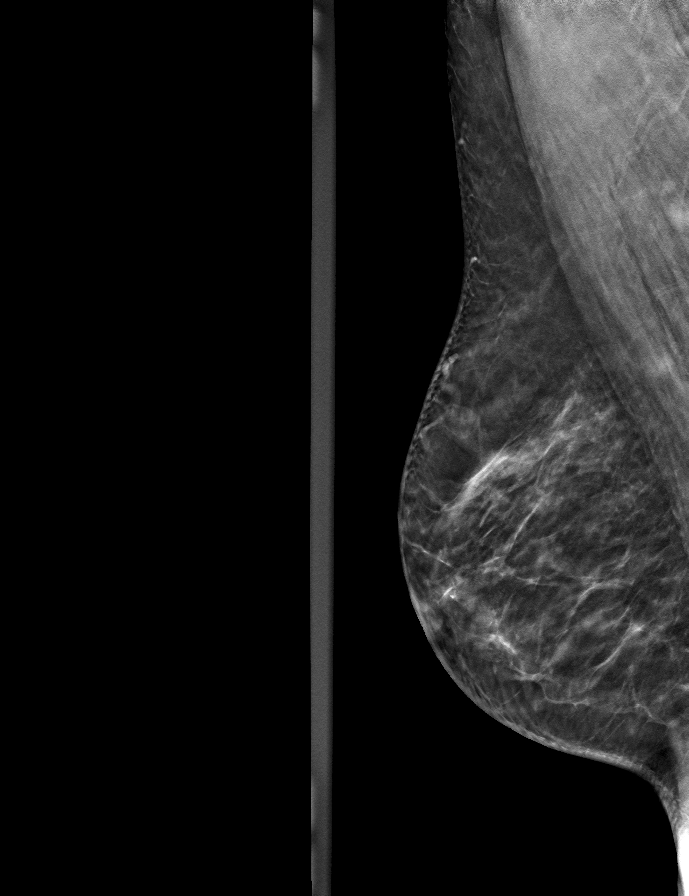

[9 of 24 positions shown; findings below may reference images not displayed]

ACR Breast Density Category b: There are scattered areas of
fibroglandular density.
FINDINGS: There are no findings suspicious for malignancy.
IMPRESSION: No mammographic evidence of malignancy. A result letter of this
screening mammogram will be mailed directly to the patient.

RECOMMENDATION:
Screening mammogram in one year. (Code:51-O-LD2)

BI-RADS CATEGORY  1: Negative.
